# Patient Record
Sex: Female | Born: 1990 | Race: White | Hispanic: No | Marital: Single | Smoking: Former smoker
Health system: Southern US, Community
[De-identification: ages and names within clinical notes are randomized; demographics above are authoritative.]

## PROBLEM LIST (undated history)

## (undated) ENCOUNTER — Inpatient Hospital Stay (HOSPITAL_COMMUNITY): Payer: Self-pay

## (undated) DIAGNOSIS — F329 Major depressive disorder, single episode, unspecified: Secondary | ICD-10-CM

## (undated) DIAGNOSIS — F32A Depression, unspecified: Secondary | ICD-10-CM

## (undated) HISTORY — PX: NO PAST SURGERIES: SHX2092

---
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
## 2013-08-17 NOTE — L&D Delivery Note (Signed)
Delivery Note At 2:27 PM a viable female was delivered via Vaginal, Spontaneous Delivery (Presentation: ; Occiput Anterior).  APGAR: 8, 10; weight pending.   Placenta status: Intact, Spontaneous.  Cord: 3 vessels with the following complications: None.  Cord pH: n/a  Anesthesia: Epidural  Episiotomy: None Lacerations: 1st degree;Perineal Suture Repair: 3.0 vicryl Est. Blood Loss (mL): 350mL  Mom to postpartum.  Baby to Couplet care / Skin to Skin.  Ann Romero, Ann Romero 05/26/2014, 3:12 PM

## 2013-08-17 NOTE — L&D Delivery Note (Signed)
Birth supervised by me. Presentation was ROA. Infant placed directly on mom's abdomen for bonding/skin-to-skin. Cord allowed to stop pulsing, and was clamped x 2, and cut by fob. Repair by me- Bilateral labial and 1st degree perineal.  Plans to breastfeed, op circ, undecided about contraception.  Cheral MarkerKimberly R. Reizel Calzada, CNM, WHNP-BC 05/26/2014 9:02 PM

## 2013-10-06 ENCOUNTER — Inpatient Hospital Stay (HOSPITAL_COMMUNITY): Payer: Medicaid Other

## 2013-10-06 ENCOUNTER — Encounter (HOSPITAL_COMMUNITY): Payer: Self-pay | Admitting: *Deleted

## 2013-10-06 ENCOUNTER — Inpatient Hospital Stay (HOSPITAL_COMMUNITY)
Admission: AD | Admit: 2013-10-06 | Discharge: 2013-10-06 | Disposition: A | Discharge status: Home / Self Care | Payer: Medicaid Other | Source: Ambulatory Visit | Attending: Obstetrics & Gynecology | Admitting: Obstetrics & Gynecology

## 2013-10-06 DIAGNOSIS — R109 Unspecified abdominal pain: Secondary | ICD-10-CM | POA: Insufficient documentation

## 2013-10-06 DIAGNOSIS — O209 Hemorrhage in early pregnancy, unspecified: Secondary | ICD-10-CM

## 2013-10-06 DIAGNOSIS — Z3490 Encounter for supervision of normal pregnancy, unspecified, unspecified trimester: Secondary | ICD-10-CM

## 2013-10-06 DIAGNOSIS — O26859 Spotting complicating pregnancy, unspecified trimester: Secondary | ICD-10-CM

## 2013-10-06 HISTORY — DX: Major depressive disorder, single episode, unspecified: F32.9

## 2013-10-06 HISTORY — DX: Depression, unspecified: F32.A

## 2013-10-06 LAB — CBC
HEMATOCRIT: 32.9 % — AB (ref 36.0–46.0)
Hemoglobin: 11.3 g/dL — ABNORMAL LOW (ref 12.0–15.0)
MCH: 28.8 pg (ref 26.0–34.0)
MCHC: 34.3 g/dL (ref 30.0–36.0)
MCV: 83.9 fL (ref 78.0–100.0)
PLATELETS: 293 10*3/uL (ref 150–400)
RBC: 3.92 MIL/uL (ref 3.87–5.11)
RDW: 14.2 % (ref 11.5–15.5)
WBC: 9 10*3/uL (ref 4.0–10.5)

## 2013-10-06 LAB — WET PREP, GENITAL
Clue Cells Wet Prep HPF POC: NONE SEEN
Trich, Wet Prep: NONE SEEN
YEAST WET PREP: NONE SEEN

## 2013-10-06 LAB — POCT PREGNANCY, URINE: Preg Test, Ur: POSITIVE — AB

## 2013-10-06 LAB — HCG, QUANTITATIVE, PREGNANCY: hCG, Beta Chain, Quant, S: 27346 m[IU]/mL — ABNORMAL HIGH (ref <5)

## 2013-10-06 NOTE — Discharge Instructions (Signed)
Vaginal Bleeding During Pregnancy, First Trimester °A small amount of bleeding (spotting) from the vagina is relatively common in early pregnancy. It usually stops on its own. Various things may cause bleeding or spotting in early pregnancy. Some bleeding may be related to the pregnancy, and some may not. In most cases, the bleeding is normal and is not a problem. However, bleeding can also be a sign of something serious. Be sure to tell your health care provider about any vaginal bleeding right away. °Some possible causes of vaginal bleeding during the first trimester include: °· Infection or inflammation of the cervix. °· Growths (polyps) on the cervix. °· Miscarriage or threatened miscarriage. °· Pregnancy tissue has developed outside of the uterus and in a fallopian tube (tubal pregnancy). °· Tiny cysts have developed in the uterus instead of pregnancy tissue (molar pregnancy). °HOME CARE INSTRUCTIONS  °Watch your condition for any changes. The following actions may help to lessen any discomfort you are feeling: °· Follow your health care provider's instructions for limiting your activity. If your health care provider orders bed rest, you may need to stay in bed and only get up to use the bathroom. However, your health care provider may allow you to continue light activity. °· If needed, make plans for someone to help with your regular activities and responsibilities while you are on bed rest. °· Keep track of the number of pads you use each day, how often you change pads, and how soaked (saturated) they are. Write this down. °· Do not use tampons. Do not douche. °· Do not have sexual intercourse or orgasms until approved by your health care provider. °· If you pass any tissue from your vagina, save the tissue so you can show it to your health care provider. °· Only take over-the-counter or prescription medicines as directed by your health care provider. °· Do not take aspirin because it can make you  bleed. °· Keep all follow-up appointments as directed by your health care provider. °SEEK MEDICAL CARE IF: °· You have any vaginal bleeding during any part of your pregnancy. °· You have cramps or labor pains. °SEEK IMMEDIATE MEDICAL CARE IF:  °· You have severe cramps in your back or belly (abdomen). °· You have a fever, not controlled by medicine. °· You pass large clots or tissue from your vagina. °· Your bleeding increases. °· You feel lightheaded or weak, or you have fainting episodes. °· You have chills. °· You are leaking fluid or have a gush of fluid from your vagina. °· You pass out while having a bowel movement. °MAKE SURE YOU: °· Understand these instructions. °· Will watch your condition. °· Will get help right away if you are not doing well or get worse. °Document Released: 05/13/2005 Document Revised: 05/24/2013 Document Reviewed: 04/10/2013 °ExitCare® Patient Information ©2014 [employer]. ° °

## 2013-10-06 NOTE — MAU Provider Note (Signed)
Chief Complaint: No chief complaint on file.   First Provider Initiated Contact with Patient 10/06/13 2224     SUBJECTIVE HPI: Ann Romero is a 23 y.o. G1P1001 at 7weeks by LMP who presents to maternity admissions reporting bright red vaginal bleeding x1 episode ~4 hours ago when wiping which resolved and abdominal cramping x2-3 days.  She denies vaginal itching/burning, urinary symptoms, h/a, dizziness, n/v, or fever/chills.   Past Medical History  Diagnosis Date  . Depression    History reviewed. No pertinent past surgical history. History   Social History  . Marital Status: Single    Spouse Name: N/A    Number of Children: N/A  . Years of Education: N/A   Occupational History  . Not on file.   Social History Main Topics  . Smoking status: Never Smoker   . Smokeless tobacco: Never Used  . Alcohol Use: No  . Drug Use: No  . Sexual Activity: Yes   Other Topics Concern  . Not on file   Social History Narrative  . No narrative on file   No current facility-administered medications on file prior to encounter.   No current outpatient prescriptions on file prior to encounter.   Allergies  Allergen Reactions  . Penicillins Hives    ROS: Pertinent items in HPI  OBJECTIVE Blood pressure 104/63, pulse 101, temperature 97.7 F (36.5 C), temperature source Oral, resp. rate 16, height 5\' 3"  (1.6 m), weight 63.504 kg (140 lb), last menstrual period 08/17/2013, SpO2 100.00%. GENERAL: Well-developed, well-nourished female in no acute distress.  HEENT: Normocephalic HEART: normal rate RESP: normal effort ABDOMEN: Soft, non-tender EXTREMITIES: Nontender, no edema NEURO: Alert and oriented Pelvic exam: Cervix pink, visually closed, nonfriable, without lesion, scant white creamy discharge, no blood noted, vaginal walls and external genitalia normal Bimanual exam: Cervix 0/long/high, firm, anterior, neg CMT, uterus nontender, slightly enlarged, adnexa without tenderness,  enlargement, or mass  LAB RESULTS Results for orders placed during the hospital encounter of 10/06/13 (from the past 24 hour(s))  CBC     Status: Abnormal   Collection Time    10/06/13 10:08 PM      Result Value Ref Range   WBC 9.0  4.0 - 10.5 K/uL   RBC 3.92  3.87 - 5.11 MIL/uL   Hemoglobin 11.3 (*) 12.0 - 15.0 g/dL   HCT 04.5 (*) 40.9 - 81.1 %   MCV 83.9  78.0 - 100.0 fL   MCH 28.8  26.0 - 34.0 pg   MCHC 34.3  30.0 - 36.0 g/dL   RDW 91.4  78.2 - 95.6 %   Platelets 293  150 - 400 K/uL  HCG, QUANTITATIVE, PREGNANCY     Status: Abnormal   Collection Time    10/06/13 10:08 PM      Result Value Ref Range   hCG, Beta Chain, Quant, [place] [address] (*) <5 mIU/mL  WET PREP, GENITAL     Status: Abnormal   Collection Time    10/06/13 10:26 PM      Result Value Ref Range   Yeast Wet Prep HPF POC NONE SEEN  NONE SEEN   Trich, Wet Prep NONE SEEN  NONE SEEN   Clue Cells Wet Prep HPF POC NONE SEEN  NONE SEEN   WBC, Wet Prep HPF POC MANY (*) NONE SEEN  POCT PREGNANCY, URINE     Status: Abnormal   Collection Time    10/06/13 10:28 PM      Result Value Ref Range   Preg  Test, Ur POSITIVE (*) NEGATIVE    IMAGING US Ob Comp Less 14 Wks  10/06/2013   CLINICAL DATA:  Bright red vaginal spotting.  EXAM: OBSTETRIC <14 WK [place] AND TRANSVAGINAL OB US  TECHNIQUE: Both transabdominal and transvaginal ultrasound examinations were performed for complete evaluation of the gestation as well as the maternal uterus, adnexal regions, and pelvic cul-de-sac. Transvaginal technique was performed to assess early pregnancy.  COMPARISON:  None.  FINDINGS: Intrauterine gestational sac: Visualized/normal in shape.  Yolk sac:  Yes  Embryo:  No  Cardiac Activity: N/A  MSD:  15.7  mm   6 w   3  d  Maternal uterus/adnexae: No subchorionic hemorrhage is noted. The uterus is otherwise unremarkable in appearance.  The right ovary is not visualized. The left ovary is unremarkable in appearance. It measures 2.1 x 1.6 x 1.7 cm.  No free  fluid is seen in the pelvic cul-de-sac.  IMPRESSION: Single intrauterine gestational sac noted, with a yolk sac visualized. The embryo is not yet seen. The mean sac diameter of 1.6 cm corresponds to a gestational age of [redacted] weeks 3 days. This matches the gestational age of [redacted] weeks 1 day by LMP, reflecting an estimated date of delivery of May 24, 2014.   Electronically Signed   By: Roanna Raider M.D.   On: 10/06/2013 23:07   US Ob Transvaginal  10/06/2013   CLINICAL DATA:  Bright red vaginal spotting.  EXAM: OBSTETRIC <14 WK [place] AND TRANSVAGINAL OB US  TECHNIQUE: Both transabdominal and transvaginal ultrasound examinations were performed for complete evaluation of the gestation as well as the maternal uterus, adnexal regions, and pelvic cul-de-sac. Transvaginal technique was performed to assess early pregnancy.  COMPARISON:  None.  FINDINGS: Intrauterine gestational sac: Visualized/normal in shape.  Yolk sac:  Yes  Embryo:  No  Cardiac Activity: N/A  MSD:  15.7  mm   6 w   3  d  Maternal uterus/adnexae: No subchorionic hemorrhage is noted. The uterus is otherwise unremarkable in appearance.  The right ovary is not visualized. The left ovary is unremarkable in appearance. It measures 2.1 x 1.6 x 1.7 cm.  No free fluid is seen in the pelvic cul-de-sac.  IMPRESSION: Single intrauterine gestational sac noted, with a yolk sac visualized. The embryo is not yet seen. The mean sac diameter of 1.6 cm corresponds to a gestational age of [redacted] weeks 3 days. This matches the gestational age of [redacted] weeks 1 day by LMP, reflecting an estimated date of delivery of May 24, 2014.   Electronically Signed   By: Roanna Raider M.D.   On: 10/06/2013 23:07    ASSESSMENT 1. Normal IUP (intrauterine pregnancy) on prenatal ultrasound   2. Vaginal bleeding in pregnant patient at less than [redacted] weeks gestation     PLAN Discharge home with bleeding precautions Pregnancy verification letter given F/U with early prenatal care at [facility] as planned Return to MAU as needed    Medication List         multivitamin-prenatal 27-0.8 MG Tabs tablet  Take 1 tablet by mouth daily at 12 noon.     sertraline 50 MG tablet  Commonly known as:  ZOLOFT  Take 50 mg by mouth daily.       Follow-up Information   Follow up with [facility] [place]. (Or prenatal provider of your choice)    Contact information:   [address] [place] [address] [phone]  [facility] [place] [facility]. (If symptoms worsen)    Contact information:   [address] [id number] [place] [id number] [phone]      Sharen CounterLisa Leftwich-Kirby Certified Nurse-Midwife 10/06/2013  11:28 PM

## 2013-10-06 NOTE — MAU Note (Signed)
Patient states she saw a gush a blood while using the bathroom around 6 pm. No vaginal bleeding now. Abdominal cramping for the last week. Multiple +HPT. LMP 08/17/13

## 2013-10-07 LAB — GC/CHLAMYDIA PROBE AMP
CT PROBE, AMP APTIMA: NEGATIVE
GC Probe RNA: NEGATIVE

## 2013-10-18 ENCOUNTER — Emergency Department (HOSPITAL_BASED_OUTPATIENT_CLINIC_OR_DEPARTMENT_OTHER)
Admission: EM | Admit: 2013-10-18 | Discharge: 2013-10-18 | Disposition: A | Discharge status: Home / Self Care | Payer: Medicaid Other | Attending: Emergency Medicine | Admitting: Emergency Medicine

## 2013-10-18 ENCOUNTER — Encounter (HOSPITAL_BASED_OUTPATIENT_CLINIC_OR_DEPARTMENT_OTHER): Payer: Self-pay | Admitting: Emergency Medicine

## 2013-10-18 DIAGNOSIS — Z880 Allergy status to penicillin: Secondary | ICD-10-CM | POA: Insufficient documentation

## 2013-10-18 DIAGNOSIS — O98519 Other viral diseases complicating pregnancy, unspecified trimester: Secondary | ICD-10-CM | POA: Insufficient documentation

## 2013-10-18 DIAGNOSIS — Z79899 Other long term (current) drug therapy: Secondary | ICD-10-CM | POA: Insufficient documentation

## 2013-10-18 DIAGNOSIS — F3289 Other specified depressive episodes: Secondary | ICD-10-CM | POA: Insufficient documentation

## 2013-10-18 DIAGNOSIS — B349 Viral infection, unspecified: Secondary | ICD-10-CM

## 2013-10-18 DIAGNOSIS — B9789 Other viral agents as the cause of diseases classified elsewhere: Secondary | ICD-10-CM | POA: Insufficient documentation

## 2013-10-18 DIAGNOSIS — Z87891 Personal history of nicotine dependence: Secondary | ICD-10-CM | POA: Insufficient documentation

## 2013-10-18 DIAGNOSIS — F329 Major depressive disorder, single episode, unspecified: Secondary | ICD-10-CM | POA: Insufficient documentation

## 2013-10-18 DIAGNOSIS — O99340 Other mental disorders complicating pregnancy, unspecified trimester: Secondary | ICD-10-CM | POA: Insufficient documentation

## 2013-10-18 LAB — RAPID STREP SCREEN (MED CTR MEBANE ONLY): Streptococcus, Group A Screen (Direct): NEGATIVE

## 2013-10-18 NOTE — ED Provider Notes (Signed)
CSN: [id number]     Arrival date & time 10/18/13  1559 History   First MD Initiated Contact with Patient 10/18/13 1612     Chief Complaint  Patient presents with  . URI     (Consider location/radiation/quality/duration/timing/severity/associated sxs/prior Treatment) Patient is a 23 y.o. female presenting with URI. The history is provided by the patient. No language interpreter was used.  URI Presenting symptoms: congestion, cough, fever and sore throat   Severity:  Moderate Onset quality:  Gradual Duration:  2 weeks Timing:  Constant Progression:  Worsening Chronicity:  New Relieved by:  Nothing Worsened by:  Nothing tried Associated symptoms: no headaches and no myalgias   Risk factors: sick contacts   Risk factors: no recent illness   Pt is 9 weeks pregant.   Pt reports family member had the same.  Pt here with child who has fever of 102.  Pt has had spotting on and off.  [facility]  Past Medical History  Diagnosis Date  . Depression    History reviewed. No pertinent past surgical history. No family history on file. History  Substance Use Topics  . Smoking status: Former Games developermoker  . Smokeless tobacco: Never Used  . Alcohol Use: No   OB History   Grav Para Term Preterm Abortions TAB SAB Ect Mult Living   2 1 1       1      Review of Systems  Constitutional: Positive for fever.  HENT: Positive for congestion and sore throat.   Respiratory: Positive for cough.   Musculoskeletal: Negative for myalgias.  Neurological: Negative for headaches.  All other systems reviewed and are negative.      Allergies  Penicillins  Home Medications   Current Outpatient Rx  Name  Route  Sig  Dispense  Refill  . Prenatal Vit-Fe Fumarate-FA (MULTIVITAMIN-PRENATAL) 27-0.8 MG TABS tablet   Oral   Take 1 tablet by mouth daily at 12 noon.         . sertraline (ZOLOFT) 50 MG tablet   Oral   Take 50 mg by mouth daily.          BP 104/60   Pulse 105  Temp(Src) 98.3 F (36.8 C) (Oral)  Resp 16  Ht 5\' 3"  (1.6 m)  Wt 140 lb (63.504 kg)  BMI 24.81 kg/m2  SpO2 99%  LMP 08/17/2013  Breastfeeding? No Physical Exam  Nursing note and vitals reviewed. Constitutional: She is oriented to person, place, and time. She appears well-developed and well-nourished.  HENT:  Head: Normocephalic and atraumatic.  Right Ear: External ear normal.  Nose: Nose normal.  Erythema throat  Eyes: Conjunctivae and EOM are normal. Pupils are equal, round, and reactive to light.  Neck: Normal range of motion. Neck supple.  Cardiovascular: Normal rate and normal heart sounds.   Pulmonary/Chest: Effort normal and breath sounds normal.  Abdominal: Soft. She exhibits no distension.  Musculoskeletal: Normal range of motion.  Neurological: She is alert and oriented to person, place, and time.  Skin: Skin is warm.  Psychiatric: She has a normal mood and affect.    ED Course  Procedures (including critical care time) Labs Review Labs Reviewed  RAPID STREP SCREEN  CULTURE, GROUP A STREP   Imaging Review No results found.   EKG Interpretation None      MDM   Final diagnoses:  Viral illness    Pt advised follow up as schedule for prenatal care.  I think  illness is viral     Elson Areas, [place] 10/18/13 1753

## 2013-10-18 NOTE — ED Notes (Addendum)
Runny nose, sore throat and cough x2 weeks. Pt mentioned later that she is also here for suprapubic cramping and spotting x2 weeks.  Is [redacted] wks pregnant.  No obgyn care yet.  [facility].  Had US and nothing found. Problem continues.

## 2013-10-18 NOTE — Discharge Instructions (Signed)
Viral Infections °A virus is a type of germ. Viruses can cause: °· Minor sore throats. °· Aches and pains. °· Headaches. °· Runny nose. °· Rashes. °· Watery eyes. °· Tiredness. °· Coughs. °· Loss of appetite. °· Feeling sick to your stomach (nausea). °· Throwing up (vomiting). °· Watery poop (diarrhea). °HOME CARE  °· Only take medicines as told by your doctor. °· Drink enough water and fluids to keep your pee (urine) clear or pale yellow. Sports drinks are a good choice. °· Get plenty of rest and eat healthy. Soups and broths with crackers or rice are fine. °GET HELP RIGHT AWAY IF:  °· You have a very bad headache. °· You have shortness of breath. °· You have chest pain or neck pain. °· You have an unusual rash. °· You cannot stop throwing up. °· You have watery poop that does not stop. °· You cannot keep fluids down. °· You or your child has a temperature by mouth above 102° F (38.9° C), not controlled by medicine. °· Your baby is older than 3 months with a rectal temperature of 102° F (38.9° C) or higher. °· Your baby is 3 months old or younger with a rectal temperature of 100.4° F (38° C) or higher. °MAKE SURE YOU:  °· Understand these instructions. °· Will watch this condition. °· Will get help right away if you are not doing well or get worse. °Document Released: 07/16/2008 Document Revised: 10/26/2011 Document Reviewed: 12/09/2010 °ExitCare® Patient Information ©2014 [employer]. ° °

## 2013-10-21 ENCOUNTER — Encounter (HOSPITAL_COMMUNITY): Payer: Self-pay | Admitting: Family

## 2013-10-21 ENCOUNTER — Inpatient Hospital Stay (HOSPITAL_COMMUNITY): Payer: Medicaid Other

## 2013-10-21 ENCOUNTER — Inpatient Hospital Stay (HOSPITAL_COMMUNITY)
Admission: AD | Admit: 2013-10-21 | Discharge: 2013-10-21 | Disposition: A | Discharge status: Home / Self Care | Payer: Medicaid Other | Source: Ambulatory Visit | Attending: Obstetrics & Gynecology | Admitting: Obstetrics & Gynecology

## 2013-10-21 DIAGNOSIS — F3289 Other specified depressive episodes: Secondary | ICD-10-CM | POA: Insufficient documentation

## 2013-10-21 DIAGNOSIS — O418X90 Other specified disorders of amniotic fluid and membranes, unspecified trimester, not applicable or unspecified: Secondary | ICD-10-CM

## 2013-10-21 DIAGNOSIS — Z3490 Encounter for supervision of normal pregnancy, unspecified, unspecified trimester: Secondary | ICD-10-CM

## 2013-10-21 DIAGNOSIS — J069 Acute upper respiratory infection, unspecified: Secondary | ICD-10-CM | POA: Insufficient documentation

## 2013-10-21 DIAGNOSIS — O26859 Spotting complicating pregnancy, unspecified trimester: Secondary | ICD-10-CM

## 2013-10-21 DIAGNOSIS — O468X9 Other antepartum hemorrhage, unspecified trimester: Secondary | ICD-10-CM

## 2013-10-21 DIAGNOSIS — R0989 Other specified symptoms and signs involving the circulatory and respiratory systems: Secondary | ICD-10-CM

## 2013-10-21 DIAGNOSIS — Z87891 Personal history of nicotine dependence: Secondary | ICD-10-CM | POA: Insufficient documentation

## 2013-10-21 DIAGNOSIS — F329 Major depressive disorder, single episode, unspecified: Secondary | ICD-10-CM | POA: Insufficient documentation

## 2013-10-21 DIAGNOSIS — O208 Other hemorrhage in early pregnancy: Secondary | ICD-10-CM | POA: Insufficient documentation

## 2013-10-21 LAB — CBC
HCT: 33.4 % — ABNORMAL LOW (ref 36.0–46.0)
Hemoglobin: 11.5 g/dL — ABNORMAL LOW (ref 12.0–15.0)
MCH: 29.2 pg (ref 26.0–34.0)
MCHC: 34.4 g/dL (ref 30.0–36.0)
MCV: 84.8 fL (ref 78.0–100.0)
Platelets: 337 10*3/uL (ref 150–400)
RBC: 3.94 MIL/uL (ref 3.87–5.11)
RDW: 13.6 % (ref 11.5–15.5)
WBC: 11.3 10*3/uL — ABNORMAL HIGH (ref 4.0–10.5)

## 2013-10-21 LAB — ABO/RH: ABO/RH(D): O POS

## 2013-10-21 MED ORDER — HYDROCODONE-HOMATROPINE 5-1.5 MG/5ML PO SYRP: 5.0000 mL | ORAL_SOLUTION | Freq: Four times a day (QID) | ORAL | Status: DC | PRN

## 2013-10-21 MED ORDER — PROMETHAZINE HCL 25 MG PO TABS
25.0000 mg | ORAL_TABLET | Freq: Once | ORAL | Status: AC
  Administered 2013-10-21: 25 mg via ORAL
  Filled 2013-10-21: qty 1

## 2013-10-21 MED ORDER — DM-GUAIFENESIN ER 30-600 MG PO TB12
1.0000 | ORAL_TABLET | Freq: Once | ORAL | Status: AC
  Administered 2013-10-21: 1 via ORAL
  Filled 2013-10-21: qty 1

## 2013-10-21 NOTE — MAU Note (Signed)
23 yo, G2P1 at 227w2d, presents with c/o passing a quarter-sized clot today at 1030; reports occasional spotting throughout pregnancy, never passed clot. Reports lower abdominal cramping last several weeks. 10/26/13 first scheduled appt.

## 2013-10-21 NOTE — MAU Provider Note (Signed)
History     CSN: [id number]  Arrival date and time: 10/21/13 1303   First Provider Initiated Contact with Patient 10/21/13 1455      Chief Complaint  Patient presents with  . Vaginal Bleeding   HPI  [name] is a 23 y.o. female G2P1001 at 6664w2d who presents with complaints of vaginal bleeding. The bleeding was first noticed two weeks ago; the patient has not started prenatal care. She was evaluated here for the bleeding recently and no cause for the bleeding was found. She was told to come back if the bleeding increased. This morning she passed a blood clot and that warranted her to be seen by a provider. The patient plans to start care at the clinic downs stairs and requests a referral. She also complains of upper respiratory symptoms; cough, congestion. Most of her family members have had the same symptoms; pt has tried tylenol over the counter. The symptoms have been going on for 1 week and the cough is keeping her up at night.    Past Medical History  Diagnosis Date  . Depression     History reviewed. No pertinent past surgical history.  History reviewed. No pertinent family history.  History  Substance Use Topics  . Smoking status: Former Smoker    Quit date: 07/23/2013  . Smokeless tobacco: Never Used  . Alcohol Use: No    Allergies:  Allergies  Allergen Reactions  . Penicillins Hives    Prescriptions prior to admission  Medication Sig Dispense Refill  . acetaminophen (TYLENOL) 325 MG tablet Take 650 mg by mouth every 6 (six) hours as needed for mild pain.      . Prenatal Vit-Fe Fumarate-FA (PRENATAL MULTIVITAMIN) TABS tablet Take 1 tablet by mouth every morning.      . sertraline (ZOLOFT) 50 MG tablet Take 50 mg by mouth daily.       Results for orders placed during the hospital encounter of 10/21/13 (from the past 48 hour(s))  CBC     Status: Abnormal   Collection Time    10/21/13  3:12 PM      Result Value Ref Range   WBC 11.3 (*) 4.0 - 10.5  K/uL   RBC 3.94  3.87 - 5.11 MIL/uL   Hemoglobin 11.5 (*) 12.0 - 15.0 g/dL   HCT 30.133.4 (*) 60.136.0 - 09.346.0 %   MCV 84.8  78.0 - 100.0 fL   MCH 29.2  26.0 - 34.0 pg   MCHC 34.4  30.0 - 36.0 g/dL   RDW 23.513.6  57.311.5 - 22.015.5 %   Platelets 337  150 - 400 K/uL  ABO/RH     Status: None   Collection Time    10/21/13  3:12 PM      Result Value Ref Range   ABO/RH(D) O POS      [place] Ob Comp Less 14 Wks  10/21/2013   CLINICAL DATA:  Bleeding.  Unsure of LMP.  EXAM: OBSTETRIC <14 WK ULTRASOUND  TECHNIQUE: Transabdominal ultrasound was performed for evaluation of the gestation as well as the maternal uterus and adnexal regions.  COMPARISON:  US OB COMP LESS 14 WKS dated 10/06/2013  FINDINGS: Intrauterine gestational sac: Visualized/normal in shape.  Yolk sac:  Present  Embryo:  Present  Cardiac Activity: Present  Heart Rate: 172 bpm  CRL:   16.6  mm;   8 w 1 d;             US EDC: 06/01/2014  Maternal  uterus/adnexae: Small areas of subchorionic hemorrhage are noted, measuring 2.4 x 0.6 x 1.2 cm and 1.7 x 0.8 x 0.5 cm. The right and left ovaries were not clearly visualized.  IMPRESSION: Single viable fetus 8 weeks 1 day gestational age with an EDC of 06/01/2014.  Two small areas of subchorionic hemorrhage as described above which were not present previously.   Electronically Signed   By: Davonna Belling M.D.   On: 10/21/2013 17:37     Review of Systems  Constitutional: Negative for fever and chills.  HENT: Positive for congestion.   Respiratory: Positive for cough.   Gastrointestinal: Negative for abdominal pain.  Genitourinary:       No vaginal discharge. + vaginal bleeding. No dysuria.    Physical Exam   Blood pressure 105/61, pulse 117, temperature 98.3 F (36.8 C), temperature source Oral, resp. rate 15, height 5\' 3"  (1.6 m), weight 63.504 kg (140 lb), last menstrual period 08/17/2013, SpO2 100.00%, not currently breastfeeding.  Physical Exam  Vitals reviewed. Constitutional: She is oriented to person,  place, and time. She appears well-developed and well-nourished. No distress.  HENT:  Head: Normocephalic.  Eyes: Pupils are equal, round, and reactive to light.  Neck: Normal range of motion. Neck supple.  Cardiovascular: Normal rate.   Respiratory: Effort normal and breath sounds normal. No respiratory distress. She has no wheezes. She has no rales.  GI: Soft. She exhibits no distension. There is no tenderness. There is no rebound.  Musculoskeletal: Normal range of motion.  Neurological: She is alert and oriented to person, place, and time.  Skin: Skin is warm and dry.  Psychiatric: She has a normal mood and affect.    MAU Course  Procedures None  Care handed over to [facility] Rash, NP CBC  O positive blood type  A list of safe medications to take for cold has been provided to the patient  Pt has a prenatal appointment scheduled in [place] next week and plans to transfer to the clinic downstairs.   Assessment and Plan   A:  1. Normal IUP (intrauterine pregnancy) on prenatal ultrasound   2. Subchorionic hemorrhage   3. Symptoms of upper respiratory infection (URI)     P:  Discharge home in stable condition  Bleeding precautions discussed  RX: hycodan  Over the counter medications discussed  Start prenatal care ASAP Seek medical attention if URI symptoms do not improve or subside in a week Pelvic rest  Ann Hansen Cresencia Asmus, NP 10/21/2013 8:10 PM   LINEBERRY,SUSAN 10/21/2013, 5:37 PM

## 2013-10-23 LAB — CULTURE, GROUP A STREP

## 2013-10-24 NOTE — Progress Notes (Signed)
ED Antimicrobial Stewardship Positive Culture Follow Up   Ann Romero is an 23 y.o. female who presented to [facility] on 10/18/2013 with a chief complaint of  Chief Complaint  Patient presents with  . URI    Recent Results (from the past 720 hour(s))  WET PREP, GENITAL     Status: Abnormal   Collection Time    10/06/13 10:26 PM      Result Value Ref Range Status   Yeast Wet Prep HPF POC NONE SEEN  NONE SEEN Final   Trich, Wet Prep NONE SEEN  NONE SEEN Final   Clue Cells Wet Prep HPF POC NONE SEEN  NONE SEEN Final   WBC, Wet Prep HPF POC MANY (*) NONE SEEN Final   Comment: MODERATE BACTERIA SEEN  GC/CHLAMYDIA PROBE AMP     Status: None   Collection Time    10/06/13 10:26 PM      Result Value Ref Range Status   CT Probe RNA NEGATIVE  NEGATIVE Final   GC Probe RNA NEGATIVE  NEGATIVE Final   Comment: (NOTE)                                                                                               **Normal Reference Range: Negative**          Assay performed using the Gen-Probe APTIMA COMBO2 (R) Assay.     Acceptable specimen types for this assay include APTIMA Swabs (Unisex,     endocervical, urethral, or vaginal), first void urine, and ThinPrep     liquid based cytology samples.     Performed at [facility]  RAPID STREP SCREEN     Status: None   Collection Time    10/18/13  4:38 PM      Result Value Ref Range Status   Streptococcus, Group A Screen (Direct) NEGATIVE  NEGATIVE Final   Comment: (NOTE)     A Rapid Antigen test may result negative if the antigen level in the     sample is below the detection level of this test. The FDA has not     cleared this test as a stand-alone test therefore the rapid antigen     negative result has reflexed to a Group A Strep culture.  CULTURE, GROUP A STREP     Status: None   Collection Time    10/18/13  4:38 PM      Result Value Ref Range Status   Specimen Description THROAT   Final   Special Requests NONE   Final   Culture     Final   Value: STREPTOCOCCUS,BETA HEMOLYTIC NOT GROUP A     Performed at [facility]   Report Status 10/23/2013 FINAL   Final   22yo pregnant female presented to ED on 3/4 and 3/7 with URI complaints (as well as vaginal bleeding which was addressed at [facility]). Discussed culture findings with Tiburcio Pea PA - she recommends to proceed with treatment below.  New antibiotic prescription: Azithromycin 500mg  PO on Day 1, then 250mg  PO daily on Days 2-5.  ED Provider: Arthor Captain, PA-C  Cleon DewDulaney, [place] Robert 10/24/2013, 11:49 AM Infectious Diseases Pharmacist Phone# [phone]

## 2013-10-25 ENCOUNTER — Telehealth (HOSPITAL_COMMUNITY): Payer: Self-pay | Admitting: *Deleted

## 2013-10-25 NOTE — ED Notes (Signed)
Current srciber called to check on condition of patient and patient is feeling better .

## 2013-10-25 NOTE — ED Provider Notes (Signed)
Medical screening examination/treatment/procedure(s) were performed by non-physician practitioner and as supervising physician I was immediately available for consultation/collaboration.   EKG Interpretation None        Clorissa Gruenberg, MD 10/25/13 0722 

## 2013-11-16 LAB — OB RESULTS CONSOLE HEPATITIS B SURFACE ANTIGEN: Hepatitis B Surface Ag: NEGATIVE

## 2013-11-16 LAB — OB RESULTS CONSOLE ANTIBODY SCREEN: Antibody Screen: NEGATIVE

## 2013-11-16 LAB — OB RESULTS CONSOLE RUBELLA ANTIBODY, IGM: Rubella: IMMUNE

## 2013-11-16 LAB — OB RESULTS CONSOLE ABO/RH: RH TYPE: POSITIVE

## 2013-11-16 LAB — OB RESULTS CONSOLE RPR: RPR: NONREACTIVE

## 2013-11-16 LAB — OB RESULTS CONSOLE HIV ANTIBODY (ROUTINE TESTING): HIV: NONREACTIVE

## 2013-11-29 ENCOUNTER — Encounter (HOSPITAL_COMMUNITY): Payer: Self-pay | Admitting: *Deleted

## 2013-11-29 ENCOUNTER — Inpatient Hospital Stay (HOSPITAL_COMMUNITY)
Admission: AD | Admit: 2013-11-29 | Discharge: 2013-11-29 | Disposition: A | Discharge status: Home / Self Care | Payer: Medicaid Other | Source: Ambulatory Visit | Attending: Obstetrics & Gynecology | Admitting: Obstetrics & Gynecology

## 2013-11-29 DIAGNOSIS — IMO0002 Reserved for concepts with insufficient information to code with codable children: Secondary | ICD-10-CM | POA: Insufficient documentation

## 2013-11-29 DIAGNOSIS — O208 Other hemorrhage in early pregnancy: Secondary | ICD-10-CM | POA: Insufficient documentation

## 2013-11-29 DIAGNOSIS — O209 Hemorrhage in early pregnancy, unspecified: Secondary | ICD-10-CM | POA: Insufficient documentation

## 2013-11-29 DIAGNOSIS — Z87891 Personal history of nicotine dependence: Secondary | ICD-10-CM | POA: Insufficient documentation

## 2013-11-29 DIAGNOSIS — Y92009 Unspecified place in unspecified non-institutional (private) residence as the place of occurrence of the external cause: Secondary | ICD-10-CM | POA: Insufficient documentation

## 2013-11-29 DIAGNOSIS — R109 Unspecified abdominal pain: Secondary | ICD-10-CM | POA: Insufficient documentation

## 2013-11-29 LAB — URINALYSIS, ROUTINE W REFLEX MICROSCOPIC
Bilirubin Urine: NEGATIVE
Glucose, UA: NEGATIVE mg/dL
Hgb urine dipstick: NEGATIVE
Ketones, ur: NEGATIVE mg/dL
NITRITE: NEGATIVE
PROTEIN: NEGATIVE mg/dL
SPECIFIC GRAVITY, URINE: 1.01 (ref 1.005–1.030)
Urobilinogen, UA: 0.2 mg/dL (ref 0.0–1.0)
pH: 5.5 (ref 5.0–8.0)

## 2013-11-29 LAB — URINE MICROSCOPIC-ADD ON

## 2013-11-29 NOTE — MAU Provider Note (Signed)
History     CSN: [id number]  Arrival date and time: 11/29/13 1542   None     No chief complaint on file.  HPI  [name] is a 23 y.o. G2P1001 10995w5d presenting for some spotting of blood after her toddler fell over her suprapubic/lower abdominal area. Patient reports that she was laying in bed when her toddler "jumped and landed on her lower abdomen". 30 minutes later, the patient states she noticed some spotting of blood. She admits crampy abdominal pains that have been present throughout her pregnancy. Denies vaginal pain, N/V, cp, sob. [name] has urinated since and did not have any issues including hematuria. She presents here in the MAU "to make sure everything is okay".  Of note, patient had an US done on 10/21/2013, that showed per Dr. Davonna BellingJohn Curnes, "Two small areas of subchorionic hemorrhage as described above which were not present previously".   OB History   Grav Para Term Preterm Abortions TAB SAB Ect Mult Living   2 1 1       1       Past Medical History  Diagnosis Date  . Depression     History reviewed. No pertinent past surgical history.  History reviewed. No pertinent family history.  History  Substance Use Topics  . Smoking status: Former Smoker    Quit date: 07/23/2013  . Smokeless tobacco: Never Used  . Alcohol Use: No    Allergies:  Allergies  Allergen Reactions  . Penicillins Hives    Prescriptions prior to admission  Medication Sig Dispense Refill  . acetaminophen (TYLENOL) 325 MG tablet Take 650 mg by mouth every 6 (six) hours as needed for mild pain.      . Prenatal Vit-Fe Fumarate-FA (PRENATAL MULTIVITAMIN) TABS tablet Take 1 tablet by mouth every morning.      . sertraline (ZOLOFT) 50 MG tablet Take 50 mg by mouth daily.      Marland Kitchen. HYDROcodone-homatropine (HYCODAN) 5-1.5 MG/5ML syrup Take 5 mLs by mouth every 6 (six) hours as needed for cough.  120 mL  0    ROS As in HPI  Physical Exam   Blood pressure 91/75, pulse 84,  temperature 98.5 F (36.9 C), temperature source Oral, resp. rate 20, height 5' 1.5" (1.562 m), weight 70.761 kg (156 lb), last menstrual period 08/17/2013, not currently breastfeeding.  Physical Exam  Constitutional: She is oriented to person, place, and time. She appears well-developed and well-nourished. No distress.  HENT:  Head: Normocephalic and atraumatic.  Cardiovascular: Normal rate, regular rhythm and intact distal pulses.  Exam reveals no gallop and no friction rub.   No murmur heard. Respiratory: Effort normal and breath sounds normal. No respiratory distress.  GI: Soft. Bowel sounds are normal. She exhibits no distension. There is tenderness (mild) in the right lower quadrant and suprapubic area. There is no rebound and no guarding.  Genitourinary:  Uterus is firm and at umbilicus.  Neurological: She is alert and oriented to person, place, and time.  Skin: Skin is warm and dry. She is not diaphoretic.  Psychiatric: She has a normal mood and affect.     MAU Course  Procedures  Results for orders placed during the hospital encounter of 11/29/13 (from the past 24 hour(s))  URINALYSIS, ROUTINE W REFLEX MICROSCOPIC     Status: Abnormal   Collection Time    11/29/13  4:15 PM      Result Value Ref Range   Color, Urine YELLOW  YELLOW   APPearance  CLEAR  CLEAR   Specific Gravity, Urine 1.010  1.005 - 1.030   pH 5.5  5.0 - 8.0   Glucose, UA NEGATIVE  NEGATIVE mg/dL   Hgb urine dipstick NEGATIVE  NEGATIVE   Bilirubin Urine NEGATIVE  NEGATIVE   Ketones, ur NEGATIVE  NEGATIVE mg/dL   Protein, ur NEGATIVE  NEGATIVE mg/dL   Urobilinogen, UA 0.2  0.0 - 1.0 mg/dL   Nitrite NEGATIVE  NEGATIVE   Leukocytes, UA MODERATE (*) NEGATIVE  URINE MICROSCOPIC-ADD ON     Status: Abnormal   Collection Time    11/29/13  4:15 PM      Result Value Ref Range   Squamous Epithelial / LPF MANY (*) RARE   WBC, UA 11-20  <3 WBC/hpf   RBC / HPF 3-6  <3 RBC/hpf   Bacteria, UA MANY (*) RARE   FHT:  163bpm  MDM [name] is a 23 y.o. G2P1001 7370w5d presenting for some spotting of blood likely d/t previous subchorionic hemorrhage.  Assessment and Plan   A: Mild vaginal bleeding possibly secondary to previous subchorionic hemorrhage found on US.  P[facility] home[facility]  [facility] 11/29/2013, 4:52 PM  Evaluation and management procedures were performed by PA-S under my supervision/collaboration. Chart reviewed, patient examined by me and I agree with management and plan. [facility]ained [facility] to couple. 1. Bleeding in early pregnancy      Medication List         acetaminophen 325 MG tablet  Commonly known as:  TYLENOL  Take 650 mg by mouth every 6 (six) hours as needed for mild pain.     HYDROcodone-homatropine 5-1.5 MG/5ML syrup  Commonly known as:  HYCODAN  Take 5 mLs by mouth every 6 (six) hours as needed for cough.     prenatal multivitamin Tabs tablet  Take 1 tablet by mouth every morning.     sertraline 50 MG tablet  Commonly known as:  ZOLOFT  Take 50 mg by mouth daily.       Follow-up Information   [facility] [place] On 12/14/2013.   Contact information:   [address] [place] [id number] [phone]

## 2013-11-29 NOTE — MAU Note (Signed)
Toddler jumped on stomach earlier and about laster started bleeding.  Bleeding has stopped but is still cramping.

## 2013-11-29 NOTE — Discharge Instructions (Signed)
Vaginal Bleeding During Pregnancy, Second Trimester °A small amount of bleeding (spotting) from the vagina is relatively common in pregnancy. It usually stops on its own. Various things can cause bleeding or spotting in pregnancy. Some bleeding may be related to the pregnancy, and some may not. Sometimes the bleeding is normal and is not a problem. However, bleeding can also be a sign of something serious. Be sure to tell your health care provider about any vaginal bleeding right away. °Some possible causes of vaginal bleeding during the second trimester include: °· Infection, inflammation, or growths on the cervix.   °· The placenta may be partially or completely covering the opening of the cervix inside the uterus (placenta previa). °· The placenta may have separated from the uterus (abruption of the placenta).   °· You may be having early (preterm) labor.   °· The cervix may not be strong enough to keep a baby inside the uterus (cervical insufficiency).   °· Tiny cysts may have developed in the uterus instead of pregnancy tissue (molar pregnancy).  °HOME CARE INSTRUCTIONS  °Watch your condition for any changes. The following actions may help to lessen any discomfort you are feeling: °· Follow your health care provider's instructions for limiting your activity. If your health care provider orders bed rest, you may need to stay in bed and only get up to use the bathroom. However, your health care provider may allow you to continue light activity. °· If needed, make plans for someone to help with your regular activities and responsibilities while you are on bed rest. °· Keep track of the number of pads you use each day, how often you change pads, and how soaked (saturated) they are. Write this down. °· Do not use tampons. Do not douche. °· Do not have sexual intercourse or orgasms until approved by your health care provider. °· If you pass any tissue from your vagina, save the tissue so you can show it to your  health care provider. °· Only take over-the-counter or prescription medicines as directed by your health care provider. °· Do not take aspirin because it can make you bleed. °· Do not exercise or perform any strenuous activities or heavy lifting without your health care provider's permission. °· Keep all follow-up appointments as directed by your health care provider. °SEEK MEDICAL CARE IF: °· You have any vaginal bleeding during any part of your pregnancy. °· You have cramps or labor pains. °SEEK IMMEDIATE MEDICAL CARE IF:  °· You have severe cramps in your back or belly (abdomen). °· You have contractions. °· You have a fever, not controlled by medicine. °· You have chills. °· You pass large clots or tissue from your vagina. °· Your bleeding increases. °· You feel lightheaded or weak, or you have fainting episodes. °· You are leaking fluid or have a gush of fluid from your vagina. °MAKE SURE YOU: °· Understand these instructions. °· Will watch your condition. °· Will get help right away if you are not doing well or get worse. °Document Released: 05/13/2005 Document Revised: 05/24/2013 Document Reviewed: 04/10/2013 °ExitCare® Patient Information ©2014 [employer]. ° °

## 2013-12-18 ENCOUNTER — Other Ambulatory Visit (HOSPITAL_COMMUNITY): Payer: Self-pay | Admitting: Nurse Practitioner

## 2013-12-18 DIAGNOSIS — Z3689 Encounter for other specified antenatal screening: Secondary | ICD-10-CM

## 2014-01-09 ENCOUNTER — Ambulatory Visit (HOSPITAL_COMMUNITY)
Admission: RE | Admit: 2014-01-09 | Discharge: 2014-01-09 | Disposition: A | Discharge status: Home / Self Care | Payer: Medicaid Other | Source: Ambulatory Visit | Attending: Nurse Practitioner | Admitting: Nurse Practitioner

## 2014-01-09 DIAGNOSIS — Z3689 Encounter for other specified antenatal screening: Secondary | ICD-10-CM | POA: Insufficient documentation

## 2014-01-25 ENCOUNTER — Other Ambulatory Visit (HOSPITAL_COMMUNITY): Payer: Self-pay | Admitting: Nurse Practitioner

## 2014-01-25 DIAGNOSIS — IMO0002 Reserved for concepts with insufficient information to code with codable children: Secondary | ICD-10-CM

## 2014-01-25 DIAGNOSIS — Z0489 Encounter for examination and observation for other specified reasons: Secondary | ICD-10-CM

## 2014-02-08 ENCOUNTER — Ambulatory Visit (HOSPITAL_COMMUNITY)
Admission: RE | Admit: 2014-02-08 | Discharge: 2014-02-08 | Disposition: A | Discharge status: Home / Self Care | Payer: Medicaid Other | Source: Ambulatory Visit | Attending: Nurse Practitioner | Admitting: Nurse Practitioner

## 2014-02-08 DIAGNOSIS — Z363 Encounter for antenatal screening for malformations: Secondary | ICD-10-CM | POA: Insufficient documentation

## 2014-02-08 DIAGNOSIS — IMO0002 Reserved for concepts with insufficient information to code with codable children: Secondary | ICD-10-CM

## 2014-02-08 DIAGNOSIS — Z0489 Encounter for examination and observation for other specified reasons: Secondary | ICD-10-CM

## 2014-02-08 DIAGNOSIS — Z1389 Encounter for screening for other disorder: Secondary | ICD-10-CM | POA: Insufficient documentation

## 2014-02-22 ENCOUNTER — Inpatient Hospital Stay (HOSPITAL_COMMUNITY)
Admission: AD | Admit: 2014-02-22 | Discharge: 2014-02-22 | Disposition: A | Discharge status: Home / Self Care | Payer: Medicaid Other | Source: Ambulatory Visit | Attending: Obstetrics & Gynecology | Admitting: Obstetrics & Gynecology

## 2014-02-22 ENCOUNTER — Encounter (HOSPITAL_COMMUNITY): Payer: Self-pay | Admitting: *Deleted

## 2014-02-22 DIAGNOSIS — Z87891 Personal history of nicotine dependence: Secondary | ICD-10-CM | POA: Diagnosis not present

## 2014-02-22 DIAGNOSIS — O99891 Other specified diseases and conditions complicating pregnancy: Secondary | ICD-10-CM | POA: Diagnosis not present

## 2014-02-22 DIAGNOSIS — Z3493 Encounter for supervision of normal pregnancy, unspecified, third trimester: Secondary | ICD-10-CM

## 2014-02-22 DIAGNOSIS — O9989 Other specified diseases and conditions complicating pregnancy, childbirth and the puerperium: Principal | ICD-10-CM

## 2014-02-22 DIAGNOSIS — N898 Other specified noninflammatory disorders of vagina: Secondary | ICD-10-CM | POA: Diagnosis present

## 2014-02-22 DIAGNOSIS — O4700 False labor before 37 completed weeks of gestation, unspecified trimester: Secondary | ICD-10-CM | POA: Diagnosis not present

## 2014-02-22 LAB — WET PREP, GENITAL
Clue Cells Wet Prep HPF POC: NONE SEEN
TRICH WET PREP: NONE SEEN
Yeast Wet Prep HPF POC: NONE SEEN

## 2014-02-22 LAB — URINALYSIS, ROUTINE W REFLEX MICROSCOPIC
BILIRUBIN URINE: NEGATIVE
Glucose, UA: NEGATIVE mg/dL
Hgb urine dipstick: NEGATIVE
Ketones, ur: NEGATIVE mg/dL
Nitrite: NEGATIVE
PROTEIN: NEGATIVE mg/dL
Specific Gravity, Urine: <1.005 — ABNORMAL LOW (ref 1.005–1.030)
Urobilinogen, UA: 0.2 mg/dL (ref 0.0–1.0)
pH: 6.5 (ref 5.0–8.0)

## 2014-02-22 LAB — URINE MICROSCOPIC-ADD ON

## 2014-02-22 LAB — AMNISURE RUPTURE OF MEMBRANE (ROM) NOT AT ARMC: AMNISURE: NEGATIVE

## 2014-02-22 LAB — POCT FERN TEST: POCT Fern Test: NEGATIVE

## 2014-02-22 NOTE — Discharge Instructions (Signed)
Premature Rupture and Preterm Premature Rupture of Membranes Premature rupture of membranes (PROM) is when the membranes (amniotic sac) break open before contractions or labor starts. Rupture of membranes is commonly referred to as your water breaking. If PROM occurs before 37 weeks of pregnancy, it is called preterm premature rupture of membranes (PPROM). The amniotic sac holds the fetus, keeps infection out, and performs other important functions. Having the amniotic sac rupture before 37 weeks of pregnancy can lead to serious problems and requires immediate attention by your health care provider. CAUSES  PROM near the end of the pregnancy may be caused by natural weakening of the membranes. PPROM is often due to an infection. Other factors that may be associated with PROM include:  Stretching of the amniotic sac because of carrying multiples or having too much amniotic fluid.  Trauma.  Smoking during pregnancy.  Poor nutrition.  Previous preterm birth.  Vaginal bleeding.  Little to no prenatal care.  Problems with the placenta, such as placenta previa or placental abruption. RISKS OF PROM AND PPROM  Delivering a premature baby.  Getting a serious infection of the placental tissues (chorioamnionitis).  Early detachment of the placenta from the uterus (placental abruption).  Compression of the umbilical cord.  Needing a cesarean birth.  Developing a serious infection after delivery. SIGNS OF PROM OR PPROM   A sudden gush or slow leaking of fluid from the vagina.  Constant wet underwear. Sometimes, women mistake the leaking or wetness for urine, especially if the leak is slow and not a gush of fluid. If there is constant leaking or your underwear continues to get wet, your membranes have likely ruptured. WHAT TO DO IF YOU THINK YOUR MEMBRANES HAVE RUPTURED Call your health care provider right away. You will need to go to the hospital to get checked immediately. WHAT HAPPENS  IF YOU ARE DIAGNOSED WITH PROM OR PPROM? Once you arrive at the hospital, you will have tests done. A cervical exam will be performed to check if the cervix has softened or started to open (dilate). If you are diagnosed with PROM, you may be induced within 24 hours if you are not having contractions. If you are diagnosed with PPROM and are not having contractions, you may be induced depending on your trimester.  If you have PPROM, you:  And your baby will be monitored closely for signs of infection or other complications.  May be given an antibiotic medicine to lower the chances of an infection developing.  May be given a steroid medicine to help mature the baby's lungs faster.  May be given a medicine to stop preterm labor.  May be ordered to be on bed rest at home or in the hospital.  May be induced if complications arise for you or the baby. Your treatment will depend on many factors, such as how far along you are, the development of the baby, and other complications that may arise. Document Released: 08/03/2005 Document Revised: 05/24/2013 Document Reviewed: 11/22/2012 [facility] Patient Information 2015 [place]. This information is not intended to replace advice given to you by your health care provider. Make sure you discuss any questions you have with your health care provider.   Thanks for letting us take care of you!

## 2014-02-22 NOTE — MAU Provider Note (Signed)
None     Chief Complaint:  Vaginal Discharge   Salem CasterBreanna Berk is  23 y.o. G2P1001 at 4590w6d presents complaining of Vaginal Discharge .  She states irregular, every 10 minutes contractions are associated with none vaginal bleeding, intact, clear fluid membranes, along with active fetal movement. Pt. Was in kitchen when her husband noted a small puddle of fluid on the floor where she was standing. They also noted "gush" of fluid as she began walking to the bathroom. She is complaining of mild abdominal tightness, and a small amount of periodic abdominal pain. She denies vaginal bleeding. She has no history of preterm birth. She has not had sexual contact in the past 24 hours. She denies dysuria, hematuria, or vaginal burning / itching. She denies trauma. She has no other complaints.   Obstetrical/Gynecological History: OB History   Grav Para Term Preterm Abortions TAB SAB Ect Mult Living   2 1 1       1      Past Medical History: Past Medical History  Diagnosis Date  . Depression     Past Surgical History: Past Surgical History  Procedure Laterality Date  . No past surgeries      Family History: History reviewed. No pertinent family history.  Social History: History  Substance Use Topics  . Smoking status: Former Smoker    Quit date: 07/23/2013  . Smokeless tobacco: Never Used  . Alcohol Use: No    Allergies:  Allergies  Allergen Reactions  . Penicillins Hives    Meds:  Prescriptions prior to admission  Medication Sig Dispense Refill  . acetaminophen (TYLENOL) 325 MG tablet Take 650 mg by mouth every 6 (six) hours as needed for mild pain.      . Prenatal Vit-Fe Fumarate-FA (PRENATAL MULTIVITAMIN) TABS tablet Take 1 tablet by mouth at bedtime.       . sertraline (ZOLOFT) 50 MG tablet Take 50 mg by mouth daily.        Review of Systems - Per HPI above     Physical Exam  Blood pressure 103/68, pulse 93, temperature 98.2 F (36.8 C), temperature source Oral,  resp. rate 18, height 5\' 1"  (1.549 m), weight 78.926 kg (174 lb), last menstrual period 08/17/2013, not currently breastfeeding. GENERAL: Well-developed, well-nourished female in no acute distress.  LUNGS: Clear to auscultation bilaterally.  HEART: Regular rate and rhythm. ABDOMEN: Soft, nontender, nondistended, gravid. Appropriate for gestational age.  EXTREMITIES: Nontender, no edema, 2+ distal pulses. Grossly Neurologically intact.  SPECULUM EXAM: No pooling observed, White discharge noted, No LOF on valsalva.  CERVICAL EXAM: Deferred at this time.   FHT:  Baseline rate 135 bpm   Variability moderate  Accelerations present   Decelerations none Contractions: Irregular Every 12 mins   Labs: No results found for this or any previous visit (from the past 24 hour(s)). 1. Fern Test - Negative  Imaging Studies:  [place] Ob Follow Up  02/08/2014   OBSTETRICAL ULTRASOUND: This exam was performed within a [facility]. The OB US report was generated in the AS system, and faxed to the ordering physician.   This report is available in the [employer] PACS. See the [facility] Obstetric US report via the Image Link.   Assessment: Salem CasterBreanna Wadsworth is  23 y.o. G2P1001 at 6290w6d presents with Leakage of Fluids.  Plan: 1. Concern for Leakage of Fluids IUP @ 7590w6d - Speculum exam WNL with negative pooling observed - Fern test negative - Amnisure negative - Wet prep negative  for Trichomonas,  Yeast or BV - FHT Cat I at this time.   - Reassured pt. That it is unlikely that she has had rupture of membranes at this time. Advised to please return for further leakage, or for any other concern.   Thanks for letting us take care of you!   [place], Hillery Hunter 7/9/20152:40 PM  I spoke with and examined patient and agree with resident's note and plan of care.  Tawana Scale, MD OB Fellow 02/22/2014 4:19 PM

## 2014-02-22 NOTE — MAU Note (Signed)
Urine in lab 

## 2014-02-22 NOTE — MAU Note (Signed)
Noted leakage of fluid around 1300, lasted for about .  Did not wear a pad in, clothing is dry.

## 2014-03-22 ENCOUNTER — Encounter (HOSPITAL_COMMUNITY): Payer: Self-pay | Admitting: *Deleted

## 2014-03-22 ENCOUNTER — Inpatient Hospital Stay (HOSPITAL_COMMUNITY)
Admission: AD | Admit: 2014-03-22 | Discharge: 2014-03-22 | Disposition: A | Discharge status: Home / Self Care | Payer: Medicaid Other | Source: Ambulatory Visit | Attending: Obstetrics & Gynecology | Admitting: Obstetrics & Gynecology

## 2014-03-22 DIAGNOSIS — Z87891 Personal history of nicotine dependence: Secondary | ICD-10-CM | POA: Insufficient documentation

## 2014-03-22 DIAGNOSIS — O4690 Antepartum hemorrhage, unspecified, unspecified trimester: Secondary | ICD-10-CM | POA: Diagnosis present

## 2014-03-22 DIAGNOSIS — M545 Low back pain, unspecified: Secondary | ICD-10-CM | POA: Diagnosis not present

## 2014-03-22 DIAGNOSIS — N72 Inflammatory disease of cervix uteri: Secondary | ICD-10-CM

## 2014-03-22 LAB — WET PREP, GENITAL
Clue Cells Wet Prep HPF POC: NONE SEEN
Trich, Wet Prep: NONE SEEN
Yeast Wet Prep HPF POC: NONE SEEN

## 2014-03-22 MED ORDER — CEFTRIAXONE SODIUM 250 MG IJ SOLR
250.0000 mg | Freq: Once | INTRAMUSCULAR | Status: AC
  Administered 2014-03-22: 250 mg via INTRAMUSCULAR
  Filled 2014-03-22: qty 250

## 2014-03-22 MED ORDER — AZITHROMYCIN 250 MG PO TABS
1000.0000 mg | ORAL_TABLET | Freq: Once | ORAL | Status: AC
Start: 2014-03-22 — End: 2014-03-22
  Administered 2014-03-22: 1000 mg via ORAL
  Filled 2014-03-22: qty 4

## 2014-03-22 NOTE — Discharge Instructions (Signed)
Cervicitis °Cervicitis is a soreness and swelling (inflammation) of the cervix. Your cervix is located at the bottom of your uterus. It opens up to the vagina. °CAUSES  °· Sexually transmitted infections (STIs).   °· Allergic reaction.   °· Medicines or birth control devices that are put in the vagina.   °· Injury to the cervix.   °· Bacterial infections.   °RISK FACTORS °You are at greater risk if you: °· Have unprotected sexual intercourse. °· Have sexual intercourse with many partners. °· Began sexual intercourse at an early age. °· Have a history of STIs. °SYMPTOMS  °There may be no symptoms. If symptoms occur, they may include:  °· Gray, white, yellow, or bad-smelling vaginal discharge.   °· Pain or itching of the area outside the vagina.   °· Painful sexual intercourse.   °· Lower abdominal or lower back pain, especially during intercourse.   °· Frequent urination.   °· Abnormal vaginal bleeding between periods, after sexual intercourse, or after menopause.   °· Pressure or a heavy feeling in the pelvis.   °DIAGNOSIS  °Diagnosis is made after a pelvic exam. Other tests may include:  °· Examination of any discharge under a microscope (wet prep).   °· A Pap test.   °TREATMENT  °Treatment will depend on the cause of cervicitis. If it is caused by an STI, both you and your partner will need to be treated. Antibiotic medicines will be given.  °HOME CARE INSTRUCTIONS  °· Do not have sexual intercourse until your health care provider says it is okay.   °· Do not have sexual intercourse until your partner has been treated, if your cervicitis is caused by an STI.   °· Take your antibiotics as directed. Finish them even if you start to feel better.   °SEEK MEDICAL CARE IF: °· Your symptoms come back.   °· You have a fever.   °MAKE SURE YOU:  °· Understand these instructions. °· Will watch your condition. °· Will get help right away if you are not doing well or get worse. °Document Released: 08/03/2005 Document Revised:  08/08/2013 Document Reviewed: 01/25/2013 °ExitCare® Patient Information ©2015 ExitCare, LLC. This information is not intended to replace advice given to you by your health care provider. Make sure you discuss any questions you have with your health care provider. ° °

## 2014-03-22 NOTE — MAU Note (Signed)
Gush of bright red blood about an hour before coming to MAU. States still bleeding but not as much. Low back pain since bleeding, constant. Feeling some abdominal tightening but no pain. Denies complications with this pregnancy. No fetal movement in 4 hours.

## 2014-03-22 NOTE — MAU Provider Note (Signed)
History     CSN: [id number]  Arrival date and time: 03/22/14 2021   None     Chief Complaint  Patient presents with  . Vaginal Bleeding  . Back Pain   HPI  23 year old G2P1001 at [redacted]w[redacted]d gestation who presents with an episode of vaginal bleeding. Patient noticed at 1600 hours that she had a sensation of dripping from her vagina, checked her underpants and saw a small amount of bright red blood but no clots. Bleeding did not continue, but she noticed some small streak of red blood when she wiped after urination. No recent abdominal trauma. No bleeding tendency. She had some small amount of vaginal bleeding in her previous pregnancy that was never diagnosed as anything complicated. No contractions, no vaginal discharge. She has positive fetal movement. No fevers. No hematuria. No recent sexual contact. No history of abnormal pap smears. No previa on ultrasound.  Past Medical History  Diagnosis Date  . Depression     Past Surgical History  Procedure Laterality Date  . No past surgeries      History reviewed. No pertinent family history.  History  Substance Use Topics  . Smoking status: Former Smoker    Quit date: 07/23/2013  . Smokeless tobacco: Never Used  . Alcohol Use: No    Allergies:  Allergies  Allergen Reactions  . Penicillins Hives    Prescriptions prior to admission  Medication Sig Dispense Refill  . acetaminophen (TYLENOL) 325 MG tablet Take 650 mg by mouth every 6 (six) hours as needed for mild pain.      . Prenatal Vit-Fe Fumarate-FA (PRENATAL MULTIVITAMIN) TABS tablet Take 1 tablet by mouth at bedtime.       . sertraline (ZOLOFT) 50 MG tablet Take 50 mg by mouth daily.        Review of Systems  Constitutional: Negative for fever and chills.  HENT: Negative for hearing loss.   Eyes: Negative for blurred vision and double vision.  Respiratory: Negative for cough and shortness of breath.   Cardiovascular: Negative for chest pain and palpitations.    Gastrointestinal: Negative for heartburn, nausea, vomiting, abdominal pain, diarrhea and constipation.  Genitourinary: Negative for dysuria and hematuria.  Musculoskeletal: Negative for myalgias.  Skin: Negative for itching and rash.  Neurological: Negative for dizziness, tingling and headaches.  Endo/Heme/Allergies: Does not bruise/bleed easily.   Physical Exam   Blood pressure 111/68, pulse 92, temperature 98.4 F (36.9 C), temperature source Oral, resp. rate 18, height 5\' 3"  (1.6 m), weight 81.375 kg (179 lb 6.4 oz), last menstrual period 08/17/2013, SpO2 99.00%, not currently breastfeeding.  Physical Exam  Constitutional: She is oriented to person, place, and time. She appears well-developed and well-nourished.  Cardiovascular: Normal rate and regular rhythm.   Respiratory: Effort normal and breath sounds normal.  GI: Soft. Bowel sounds are normal. She exhibits no distension. There is no tenderness.  Genitourinary:    Musculoskeletal: She exhibits no edema and no tenderness.  Neurological: She is alert and oriented to person, place, and time.  Skin: Skin is warm and dry.   Cervix has region of multiple small hemorrhages with surrounding erythema and inflammatory changes MAU Course  Procedures  MDM Speculum exam, wet prep, GC chlamydia testing.  Assessment and Plan  Patient appears to have symptoms of cervicitis. No bleeding from cervical OS. Very minimal blood mixed with discharge in the vaginal vault. Cervix closed, no signs of active labor, no abdominal tenderness on exam. Awaiting GC chlamydia. Treat empirically  with Rocephin 250mg  IM and azithromycin 1 g. Followup in 1 week with prenatal provider.  Patient seen with Sheria LangJames Arnold  Jayveon Convey A 03/22/2014, 9:30 PM

## 2014-03-23 LAB — GC/CHLAMYDIA PROBE AMP
CT Probe RNA: NEGATIVE
GC PROBE AMP APTIMA: NEGATIVE

## 2014-05-04 LAB — OB RESULTS CONSOLE GC/CHLAMYDIA
Chlamydia: NEGATIVE
Gonorrhea: NEGATIVE

## 2014-05-04 LAB — OB RESULTS CONSOLE GBS: STREP GROUP B AG: NEGATIVE

## 2014-05-24 ENCOUNTER — Encounter (HOSPITAL_COMMUNITY): Payer: Self-pay | Admitting: *Deleted

## 2014-05-24 ENCOUNTER — Inpatient Hospital Stay (HOSPITAL_COMMUNITY)
Admission: AD | Admit: 2014-05-24 | Discharge: 2014-05-24 | Disposition: A | Discharge status: Home / Self Care | Payer: Medicaid Other | Source: Ambulatory Visit | Attending: Obstetrics & Gynecology | Admitting: Obstetrics & Gynecology

## 2014-05-24 DIAGNOSIS — O471 False labor at or after 37 completed weeks of gestation: Secondary | ICD-10-CM | POA: Insufficient documentation

## 2014-05-24 DIAGNOSIS — Z3A38 38 weeks gestation of pregnancy: Secondary | ICD-10-CM | POA: Insufficient documentation

## 2014-05-24 NOTE — MAU Note (Signed)
PT  SAYS SHE GETS PNC - AT [facility]-   SEEN LAST ON Friday-  NO VE.    SAYS HURT BAD  SINCE 5 PM.    DENIES HSV AND MRSA.   GBS- NEG

## 2014-05-24 NOTE — MAU Note (Signed)
Contractions tonight. Denies LOF or vaginal bleeding. Positive fetal movement.  

## 2014-05-25 ENCOUNTER — Inpatient Hospital Stay (HOSPITAL_COMMUNITY)
Admission: AD | Admit: 2014-05-25 | Discharge: 2014-05-25 | Disposition: A | Discharge status: Home / Self Care | Payer: Medicaid Other | Source: Ambulatory Visit | Attending: Family Medicine | Admitting: Family Medicine

## 2014-05-25 ENCOUNTER — Encounter (HOSPITAL_COMMUNITY): Payer: Self-pay | Admitting: *Deleted

## 2014-05-25 DIAGNOSIS — O471 False labor at or after 37 completed weeks of gestation: Secondary | ICD-10-CM | POA: Insufficient documentation

## 2014-05-25 DIAGNOSIS — Z3A39 39 weeks gestation of pregnancy: Secondary | ICD-10-CM | POA: Insufficient documentation

## 2014-05-25 NOTE — MAU Note (Signed)
Patient states she is having contractions every 5 minutes with bloody show. Reports good fetal movement.

## 2014-05-26 ENCOUNTER — Inpatient Hospital Stay (HOSPITAL_COMMUNITY)
Admission: AD | Admit: 2014-05-26 | Discharge: 2014-05-28 | DRG: 775 | Disposition: A | Discharge status: Home / Self Care | Payer: Medicaid Other | Source: Ambulatory Visit | Attending: Family Medicine | Admitting: Family Medicine

## 2014-05-26 ENCOUNTER — Encounter (HOSPITAL_COMMUNITY): Payer: Self-pay

## 2014-05-26 ENCOUNTER — Inpatient Hospital Stay (HOSPITAL_COMMUNITY): Payer: Medicaid Other | Admitting: Anesthesiology

## 2014-05-26 ENCOUNTER — Encounter (HOSPITAL_COMMUNITY): Payer: Medicaid Other | Admitting: Anesthesiology

## 2014-05-26 DIAGNOSIS — Z823 Family history of stroke: Secondary | ICD-10-CM

## 2014-05-26 DIAGNOSIS — Z833 Family history of diabetes mellitus: Secondary | ICD-10-CM

## 2014-05-26 DIAGNOSIS — Z87891 Personal history of nicotine dependence: Secondary | ICD-10-CM

## 2014-05-26 DIAGNOSIS — Z8249 Family history of ischemic heart disease and other diseases of the circulatory system: Secondary | ICD-10-CM

## 2014-05-26 DIAGNOSIS — Z3483 Encounter for supervision of other normal pregnancy, third trimester: Secondary | ICD-10-CM | POA: Diagnosis present

## 2014-05-26 DIAGNOSIS — IMO0001 Reserved for inherently not codable concepts without codable children: Secondary | ICD-10-CM

## 2014-05-26 DIAGNOSIS — Z3A39 39 weeks gestation of pregnancy: Secondary | ICD-10-CM | POA: Diagnosis present

## 2014-05-26 LAB — CBC
HEMATOCRIT: 36.4 % (ref 36.0–46.0)
Hemoglobin: 12.1 g/dL (ref 12.0–15.0)
MCH: 28.8 pg (ref 26.0–34.0)
MCHC: 33.2 g/dL (ref 30.0–36.0)
MCV: 86.7 fL (ref 78.0–100.0)
Platelets: 158 10*3/uL (ref 150–400)
RBC: 4.2 MIL/uL (ref 3.87–5.11)
RDW: 15.5 % (ref 11.5–15.5)
WBC: 9.1 10*3/uL (ref 4.0–10.5)

## 2014-05-26 LAB — POCT FERN TEST: POCT Fern Test: POSITIVE

## 2014-05-26 LAB — RPR

## 2014-05-26 MED ORDER — LACTATED RINGERS IV SOLN
INTRAVENOUS | Status: DC
  Administered 2014-05-26 (×2): via INTRAVENOUS

## 2014-05-26 MED ORDER — EPHEDRINE 5 MG/ML INJ
10.0000 mg | INTRAVENOUS | Status: DC | PRN
  Filled 2014-05-26: qty 2

## 2014-05-26 MED ORDER — ONDANSETRON HCL 4 MG/2ML IJ SOLN
4.0000 mg | INTRAMUSCULAR | Status: DC | PRN
Start: 2014-05-26 — End: 2014-05-28

## 2014-05-26 MED ORDER — CITRIC ACID-SODIUM CITRATE 334-500 MG/5ML PO SOLN: 30.0000 mL | ORAL | Status: DC | PRN

## 2014-05-26 MED ORDER — DIPHENHYDRAMINE HCL 50 MG/ML IJ SOLN: 12.5000 mg | INTRAMUSCULAR | Status: DC | PRN

## 2014-05-26 MED ORDER — OXYTOCIN BOLUS FROM INFUSION: 500.0000 mL | INTRAVENOUS | Status: DC

## 2014-05-26 MED ORDER — WITCH HAZEL-GLYCERIN EX PADS: 1.0000 "application " | MEDICATED_PAD | CUTANEOUS | Status: DC | PRN

## 2014-05-26 MED ORDER — OXYCODONE-ACETAMINOPHEN 5-325 MG PO TABS
1.0000 | ORAL_TABLET | ORAL | Status: DC | PRN
  Administered 2014-05-27 – 2014-05-28 (×3): 1 via ORAL
  Filled 2014-05-26 (×3): qty 1

## 2014-05-26 MED ORDER — ZOLPIDEM TARTRATE 5 MG PO TABS: 5.0000 mg | ORAL_TABLET | Freq: Every evening | ORAL | Status: DC | PRN

## 2014-05-26 MED ORDER — LANOLIN HYDROUS EX OINT: TOPICAL_OINTMENT | CUTANEOUS | Status: DC | PRN

## 2014-05-26 MED ORDER — SIMETHICONE 80 MG PO CHEW: 80.0000 mg | CHEWABLE_TABLET | ORAL | Status: DC | PRN

## 2014-05-26 MED ORDER — ONDANSETRON HCL 4 MG/2ML IJ SOLN: 4.0000 mg | Freq: Four times a day (QID) | INTRAMUSCULAR | Status: DC | PRN

## 2014-05-26 MED ORDER — INFLUENZA VAC SPLIT QUAD 0.5 ML IM SUSY
0.5000 mL | PREFILLED_SYRINGE | INTRAMUSCULAR | Status: AC
  Administered 2014-05-26: 0.5 mL via INTRAMUSCULAR

## 2014-05-26 MED ORDER — PRENATAL MULTIVITAMIN CH
1.0000 | ORAL_TABLET | Freq: Every day | ORAL | Status: DC
Start: 2014-05-27 — End: 2014-05-28
  Administered 2014-05-27 – 2014-05-28 (×2): 1 via ORAL
  Filled 2014-05-26 (×2): qty 1

## 2014-05-26 MED ORDER — IBUPROFEN 600 MG PO TABS
600.0000 mg | ORAL_TABLET | ORAL | Status: DC | PRN
  Administered 2014-05-26: 600 mg via ORAL
  Filled 2014-05-26: qty 1

## 2014-05-26 MED ORDER — FENTANYL 2.5 MCG/ML BUPIVACAINE 1/10 % EPIDURAL INFUSION (WH - ANES)
14.0000 mL/h | INTRAMUSCULAR | Status: DC | PRN
  Administered 2014-05-26: 14 mL/h via EPIDURAL
  Filled 2014-05-26: qty 125

## 2014-05-26 MED ORDER — FENTANYL 2.5 MCG/ML BUPIVACAINE 1/10 % EPIDURAL INFUSION (WH - ANES)
INTRAMUSCULAR | Status: DC | PRN
  Administered 2014-05-26: 14 mL/h via EPIDURAL

## 2014-05-26 MED ORDER — LACTATED RINGERS IV SOLN: 500.0000 mL | Freq: Once | INTRAVENOUS | Status: DC

## 2014-05-26 MED ORDER — LIDOCAINE HCL (PF) 1 % IJ SOLN
30.0000 mL | INTRAMUSCULAR | Status: DC | PRN
  Filled 2014-05-26: qty 30

## 2014-05-26 MED ORDER — LIDOCAINE HCL (PF) 1 % IJ SOLN
INTRAMUSCULAR | Status: DC | PRN
  Administered 2014-05-26 (×2): 5 mL
  Administered 2014-05-26: 3 mL

## 2014-05-26 MED ORDER — SERTRALINE HCL 50 MG PO TABS
50.0000 mg | ORAL_TABLET | Freq: Every day | ORAL | Status: DC
  Administered 2014-05-26 – 2014-05-27 (×2): 50 mg via ORAL
  Filled 2014-05-26 (×3): qty 1

## 2014-05-26 MED ORDER — OXYTOCIN 40 UNITS IN LACTATED RINGERS INFUSION - SIMPLE MED
1.0000 m[IU]/min | INTRAVENOUS | Status: DC
  Administered 2014-05-26: 2 m[IU]/min via INTRAVENOUS
  Filled 2014-05-26: qty 1000

## 2014-05-26 MED ORDER — SERTRALINE HCL 50 MG PO TABS
50.0000 mg | ORAL_TABLET | Freq: Every day | ORAL | Status: DC
  Filled 2014-05-26 (×2): qty 1

## 2014-05-26 MED ORDER — PHENYLEPHRINE 40 MCG/ML (10ML) SYRINGE FOR IV PUSH (FOR BLOOD PRESSURE SUPPORT)
80.0000 ug | PREFILLED_SYRINGE | INTRAVENOUS | Status: DC | PRN
  Filled 2014-05-26: qty 2

## 2014-05-26 MED ORDER — TERBUTALINE SULFATE 1 MG/ML IJ SOLN: 0.2500 mg | Freq: Once | INTRAMUSCULAR | Status: DC | PRN

## 2014-05-26 MED ORDER — FLEET ENEMA 7-19 GM/118ML RE ENEM: 1.0000 | ENEMA | RECTAL | Status: DC | PRN

## 2014-05-26 MED ORDER — PHENYLEPHRINE 40 MCG/ML (10ML) SYRINGE FOR IV PUSH (FOR BLOOD PRESSURE SUPPORT)
80.0000 ug | PREFILLED_SYRINGE | INTRAVENOUS | Status: DC | PRN
  Administered 2014-05-26 (×2): 40 ug via INTRAVENOUS
  Filled 2014-05-26: qty 10
  Filled 2014-05-26: qty 2

## 2014-05-26 MED ORDER — ONDANSETRON HCL 4 MG PO TABS: 4.0000 mg | ORAL_TABLET | ORAL | Status: DC | PRN

## 2014-05-26 MED ORDER — OXYCODONE-ACETAMINOPHEN 5-325 MG PO TABS: 2.0000 | ORAL_TABLET | ORAL | Status: DC | PRN

## 2014-05-26 MED ORDER — TETANUS-DIPHTH-ACELL PERTUSSIS 5-2.5-18.5 LF-MCG/0.5 IM SUSP: 0.5000 mL | Freq: Once | INTRAMUSCULAR | Status: DC

## 2014-05-26 MED ORDER — OXYCODONE-ACETAMINOPHEN 5-325 MG PO TABS: 1.0000 | ORAL_TABLET | ORAL | Status: DC | PRN

## 2014-05-26 MED ORDER — OXYTOCIN 40 UNITS IN LACTATED RINGERS INFUSION - SIMPLE MED: 62.5000 mL/h | INTRAVENOUS | Status: DC

## 2014-05-26 MED ORDER — DIBUCAINE 1 % RE OINT: 1.0000 "application " | TOPICAL_OINTMENT | RECTAL | Status: DC | PRN

## 2014-05-26 MED ORDER — ACETAMINOPHEN 325 MG PO TABS: 650.0000 mg | ORAL_TABLET | ORAL | Status: DC | PRN

## 2014-05-26 MED ORDER — DIPHENHYDRAMINE HCL 25 MG PO CAPS: 25.0000 mg | ORAL_CAPSULE | Freq: Four times a day (QID) | ORAL | Status: DC | PRN

## 2014-05-26 MED ORDER — SENNOSIDES-DOCUSATE SODIUM 8.6-50 MG PO TABS
2.0000 | ORAL_TABLET | ORAL | Status: DC
  Administered 2014-05-26 – 2014-05-27 (×2): 2 via ORAL
  Filled 2014-05-26 (×2): qty 2

## 2014-05-26 MED ORDER — IBUPROFEN 600 MG PO TABS
600.0000 mg | ORAL_TABLET | Freq: Four times a day (QID) | ORAL | Status: DC
  Administered 2014-05-26 – 2014-05-28 (×6): 600 mg via ORAL
  Filled 2014-05-26 (×7): qty 1

## 2014-05-26 MED ORDER — LACTATED RINGERS IV SOLN
500.0000 mL | INTRAVENOUS | Status: DC | PRN
  Administered 2014-05-26: 500 mL via INTRAVENOUS

## 2014-05-26 MED ORDER — BENZOCAINE-MENTHOL 20-0.5 % EX AERO
1.0000 "application " | INHALATION_SPRAY | CUTANEOUS | Status: DC | PRN
  Administered 2014-05-26: 1 via TOPICAL
  Filled 2014-05-26: qty 56

## 2014-05-26 NOTE — Progress Notes (Signed)
Ann CasterBreanna Romero is a 23 y.o. G2P1001 at 2668w1d  admitted for active labor  Subjective: More pressure in lower back. No other complaints  Objective: Filed Vitals:   05/26/14 1101 05/26/14 1116 05/26/14 1131 05/26/14 1201  BP: 103/62 121/60 109/70 109/87  Pulse: 76 84 92 138  Temp:   98.3 F (36.8 C)   TempSrc:   Oral   Resp: 18 18 18    Height:      Weight:      SpO2:          FHT:  FHR: 150 bpm, variability: moderate,  accelerations:  Present,  decelerations:  Absent UC:   regular, every 3-5 minutes SVE:   Dilation: 7 Effacement (%): 80 Station: -1 Exam by:: kim booker cnm Pitocin @ 8 mu/min  Labs: Lab Results  Component Value Date   WBC 9.1 05/26/2014   HGB 12.1 05/26/2014   HCT 36.4 05/26/2014   MCV 86.7 05/26/2014   PLT 158 05/26/2014    Assessment / Plan: Augmentation of labor, progressing well on pitocin  Labor: Progressing normally Fetal Wellbeing:  Category I Pain Control:  Epidural Anticipated MOD:  NSVD  [name], [name] 05/26/2014, 12:47 PM

## 2014-05-26 NOTE — MAU Note (Signed)
Contractions with some bloody show and back pain

## 2014-05-26 NOTE — Anesthesia Procedure Notes (Signed)
Epidural Patient location during procedure: OB  Staffing Anesthesiologist: Latresa Gasser Performed by: anesthesiologist   Preanesthetic Checklist Completed: patient identified, site marked, surgical consent, pre-op evaluation, timeout performed, IV checked, risks and benefits discussed and monitors and equipment checked  Epidural Patient position: sitting Prep: ChloraPrep Patient monitoring: heart rate, continuous pulse ox and blood pressure Approach: right paramedian Location: L3-L4 Injection technique: LOR saline  Needle:  Needle type: Tuohy  Needle gauge: 17 G Needle length: 9 cm and 9 Needle insertion depth: 6 cm Catheter type: closed end flexible Catheter size: 20 Guage Catheter at skin depth: 11 cm Test dose: negative  Assessment Events: blood not aspirated, injection not painful, no injection resistance, negative IV test and no paresthesia  Additional Notes   Patient tolerated the insertion well without complications.   

## 2014-05-26 NOTE — Lactation Note (Signed)
This note was copied from the chart of Ann Romero [place]. Lactation Consultation Note  Patient Name: Ann Romero Mothershead ZOXWR'UToday's Date: 05/26/2014 Reason for consult: Follow-up assessment Baby 8 hours of life. Mom reports difficulty getting baby latched since birth. Baby very fussy after 1-2 attempts at latching. LC can latch baby, but baby does not stay on breast and mom cannot latch baby. Fitted mom with a #20 NS, discussed with mom that it is a temporary solution. Enc mom to continue to attempt to latch baby directly at breast, especially after baby used NS for a few minutes and nipple is more everted. Mom is able to express colostrum readily. Mom states that her milk came in with first child. Enc mom to feed with cues and offer lots of STS. Enc mom to be patient with latching baby and keep baby deeply latched.   Maternal Data Has patient been taught Hand Expression?: Yes Does the patient have breastfeeding experience prior to this delivery?: Yes  Feeding Feeding Type: Breast Fed Length of feed:  (Assessed first 10 minutes of BF.)  LATCH Score/Interventions Latch: Grasps breast easily, tongue down, lips flanged, rhythmical sucking. Intervention(s): Adjust position;Assist with latch;Breast compression  Audible Swallowing: A few with stimulation  Type of Nipple: Flat Intervention(s): Hand pump  Comfort (Breast/Nipple): Soft / non-tender     Hold (Positioning): No assistance needed to correctly position infant at breast. Intervention(s): Breastfeeding basics reviewed;Support Pillows;Position options  LATCH Score: 8  Lactation Tools Discussed/Used Tools: Nipple Shields Nipple shield size: 20   Consult Status Consult Status: Follow-up Date: 05/27/14 Follow-up type: In-patient    Geralynn OchsWILLIARD, Ahnesti Townsend 05/26/2014, 11:13 PM

## 2014-05-26 NOTE — H&P (Signed)
Ann Romero is a 23 y.o. female at 7045w1d by 9034w1d U/S presenting for onset of labor and SROM.  Maternal Medical History:  Reason for admission: Rupture of membranes and contractions.  Nausea.  Contractions: Onset was 6-12 hours ago.   Frequency: regular.   Perceived severity is moderate.    Fetal activity: Perceived fetal activity is normal.   Last perceived fetal movement was within the past hour.    Prenatal Complications - Diabetes: none.    Contractions started around midnight, regular every 3-5 minutes but continued and got stronger, came to MAU to be evaluated. While in the MAU, had SROM with clear fluid. +VB (spotting), +FM. Denies HA, CP, SOB, changes in vision, RUQ pain, leg swelling.   Prenatal course: care at [facility], no complications. Normal quad on 12/18/13. 1hr GTT 84.  OB History   Grav Para Term Preterm Abortions TAB SAB Ect Mult Living   2 1 1       1      Past Medical History  Diagnosis Date  . Depression    Past Surgical History  Procedure Laterality Date  . No past surgeries     Family History: family history includes Diabetes in her maternal grandmother; Hypertension in her maternal grandfather; Stroke in her maternal aunt. Social History:  reports that she quit smoking about 10 months ago. She has never used smokeless tobacco. She reports that she does not drink alcohol or use illicit drugs.   Prenatal Transfer Tool  Maternal Diabetes: No Genetic Screening: Normal Maternal Ultrasounds/Referrals: Normal Fetal Ultrasounds or other Referrals:  None Maternal Substance Abuse:  No Significant Maternal Medications:  None Significant Maternal Lab Results:  None Other Comments:  None  Review of Systems  Constitutional: Negative for fever and chills.  Eyes: Negative for blurred vision and double vision.  Respiratory: Negative for shortness of breath.   Cardiovascular: Negative for chest pain.  Gastrointestinal: Negative for nausea, vomiting and abdominal  pain.  Genitourinary: Negative for dysuria.  Neurological: Negative for dizziness and headaches.  All other systems reviewed and are negative.   Dilation: 4.5 Effacement (%): 70 Station: -2 Exam by:: Benji StanleyRN Blood pressure 117/71, pulse 97, temperature 98.3 F (36.8 C), temperature source Oral, resp. rate 18, height 5\' 3"  (1.6 m), weight 87.091 kg (192 lb), last menstrual period 08/17/2013, not currently breastfeeding. Maternal Exam:  Abdomen: Fetal presentation: vertex  Introitus: Ferning test: positive.  Amniotic fluid character: clear.  Pelvis: adequate for delivery.      Fetal Exam Fetal Monitor Review: Mode: ultrasound.   Baseline rate: 150.  Variability: moderate (6-25 bpm).   Pattern: accelerations present and no decelerations.    Fetal State Assessment: Category I - tracings are normal.     Physical Exam  Nursing note and vitals reviewed. Constitutional: She is oriented to person, place, and time. She appears well-developed and well-nourished.  HENT:  Head: Normocephalic and atraumatic.  Cardiovascular: Normal rate, regular rhythm, normal heart sounds and intact distal pulses.  Exam reveals no gallop and no friction rub.   No murmur heard. Respiratory: Effort normal and breath sounds normal. No respiratory distress. She has no wheezes.  GI: Soft. There is no tenderness.  Musculoskeletal: She exhibits no edema and no tenderness.  Neurological: She is alert and oriented to person, place, and time.  Skin: Skin is warm and dry.  Psychiatric: She has a normal mood and affect. Her behavior is normal.    Prenatal labs: ABO, Rh: O/Positive/-- (04/02 0000) Antibody: Negative (  04/02 0000) Rubella: Immune (04/02 0000) RPR: Nonreactive (04/02 0000)  HBsAg: Negative (04/02 0000)  HIV: Non-reactive (04/02 0000)  GBS: Negative (09/18 0000)   Assessment/Plan: Ann Romero is a 23 y.o. G2P1001 at 75121w1d by 9421w1d U/S here for onset of labor, SROM  #Labor:  expectant management #Pain: epidural #FWB: Cat 1 #ID:  GBS neg #MOF: breast #MOC:undecided #Circ:  Yes, outpatient   Tawni CarnesWight, Andrew 05/26/2014, 8:00 AM    I spoke with and examined patient and agree with resident/PA/SNM's note and plan of care.  Cheral MarkerKimberly R. Trequan Marsolek, CNM, WHNP-BC 05/26/2014 10:03 AM

## 2014-05-26 NOTE — Anesthesia Preprocedure Evaluation (Signed)
Anesthesia Evaluation  Patient identified by MRN, date of birth, ID band Patient awake    Reviewed: Allergy & Precautions, H&P , NPO status , Patient's Chart, lab work & pertinent test results  History of Anesthesia Complications Negative for: history of anesthetic complications  Airway Mallampati: II TM Distance: >3 FB Neck ROM: full    Dental no notable dental hx. (+) Teeth Intact   Pulmonary neg pulmonary ROS, former smoker,  breath sounds clear to auscultation  Pulmonary exam normal       Cardiovascular negative cardio ROS  Rhythm:regular Rate:Normal     Neuro/Psych negative neurological ROS  negative psych ROS   GI/Hepatic negative GI ROS, Neg liver ROS,   Endo/Other  negative endocrine ROS  Renal/GU negative Renal ROS  negative genitourinary   Musculoskeletal   Abdominal Normal abdominal exam  (+)   Peds  Hematology negative hematology ROS (+)   Anesthesia Other Findings   Reproductive/Obstetrics (+) Pregnancy                           Anesthesia Physical Anesthesia Plan  ASA: II  Anesthesia Plan: Epidural   Post-op Pain Management:    Induction:   Airway Management Planned:   Additional Equipment:   Intra-op Plan:   Post-operative Plan:   Informed Consent: I have reviewed the patients History and Physical, chart, labs and discussed the procedure including the risks, benefits and alternatives for the proposed anesthesia with the patient or authorized representative who has indicated his/her understanding and acceptance.     Plan Discussed with:   Anesthesia Plan Comments:         Anesthesia Quick Evaluation  

## 2014-05-26 NOTE — Progress Notes (Signed)
Ann Romero is a 23 y.o. G2P1001 at 4478w1d  admitted for active labor  Subjective: Comfortable with epidural in place.   Objective: Filed Vitals:   05/26/14 0916 05/26/14 0918 05/26/14 0921 05/26/14 0922  BP: 115/67  88/44   Pulse: 108 130 104 106  Temp:      TempSrc:      Resp:      Height:      Weight:      SpO2:  99% 99%       FHT:  FHR: 150 bpm, variability: moderate,  accelerations:  Present,  decelerations:  Absent UC:   regular, every 4-5 minutes SVE:   Dilation: 4.5 Effacement (%): 70 Station: -2 Exam by:: Benji StanleyRN No Pitocin   Labs: Lab Results  Component Value Date   WBC 9.1 05/26/2014   HGB 12.1 05/26/2014   HCT 36.4 05/26/2014   MCV 86.7 05/26/2014   PLT 158 05/26/2014    Assessment / Plan: SOL, SROM  Labor: Protracted early labor, will proceed with pitocin augmentation Fetal Wellbeing:  Category I Pain Control:  Epidural Anticipated MOD:  NSVD  [name], Karleen Seebeck 05/26/2014, 9:38 AM

## 2014-05-26 NOTE — H&P (Signed)
Attestation of Attending Supervision of Advanced Practitioner (PA/CNM/NP): Evaluation and management procedures were performed by the Advanced Practitioner under my supervision and collaboration.  I have reviewed the Advanced Practitioner's note and chart, and I agree with the management and plan.  Gennesis Hogland, DO Attending Physician Faculty Practice, [facility]  

## 2014-05-26 NOTE — Lactation Note (Signed)
This note was copied from the chart of Boy [place] CasterBreanna Phifer. Lactation Consultation Note  Patient Name: Boy [place] CasterBreanna Lyons [id number]'s Date: 05/26/2014 Reason for consult: Initial assessment  Baby 7 hours of life. Baby getting first bath. Enc parents to [facility] and mom giving STS for assist with latching baby at breast. Mom given [facility], aware of OP/BFSG, community resources and [facility] phone [facility] services.  Maternal Data Does the patient have breastfeeding experience prior to this delivery?: No  Feeding Feeding Type:  (Baby being bathed, asked parents to call for assistance [facility] after bath and in bed giving STS.) Length of feed: 0 min (parents said sucked for 3-4 seconds only)  LATCH Score/Interventions                      Lactation Tools Discussed/Used     Consult Status Consult Status: Follow-up Date: 05/27/14 Follow-up type: In-patient    Geralynn OchsWILLIARD, Akili Corsetti 05/26/2014, 10:13 PM

## 2014-05-27 NOTE — Progress Notes (Signed)
Post Partum Day 1 Subjective: Eating, drinking, voiding, ambulating well.  +flatus.  Lochia and pain wnl.  Denies dizziness, lightheadedness, or sob. No complaints. May be interested in going home later today, isn't sure yet.   Objective: Blood pressure 110/57, pulse 103, temperature 98.2 F (36.8 C), temperature source Oral, resp. rate 20, height 5\' 3"  (1.6 m), weight 87.091 kg (192 lb), last menstrual period 08/17/2013, SpO2 100.00%, unknown if currently breastfeeding.  Physical Exam:  General: alert, cooperative and no distress Lochia: appropriate Uterine Fundus: firm Incision: n/a DVT Evaluation: No evidence of DVT seen on physical exam. Negative Homan's sign. No cords or calf tenderness. No significant calf/ankle edema.   Recent Labs  05/26/14 0725  HGB 12.1  HCT 36.4    Assessment/Plan: Breastfeeding well, possible d/c this afternoon- will let RN know if she decides to go, plans Mirena for contraception, OP circ   LOS: 1 day   Ann Romero, Ann Romero 05/27/2014, 7:52 AM

## 2014-05-27 NOTE — Progress Notes (Signed)

## 2014-05-27 NOTE — Anesthesia Postprocedure Evaluation (Signed)
Anesthesia Post Note  Patient: Ann Romero  Procedure(s) Performed: * No procedures listed *  Anesthesia type: Epidural  Patient location: Mother/Baby  Post pain: Pain level controlled  Post assessment: Post-op Vital signs reviewed  Last Vitals:  Filed Vitals:   05/27/14 0643  BP: 110/57  Pulse: 103  Temp: 36.8 C  Resp: 20    Post vital signs: Reviewed  Level of consciousness:alert  Complications: No apparent anesthesia complications

## 2014-05-27 NOTE — Lactation Note (Addendum)
This note was copied from the chart of Ann Romero [place]. Lactation Consultation Note  Patient Name: Ann Romero Puzio ZOXWR'UToday's Date: 05/27/2014 Reason for consult: Follow-up assessment;Difficult latch Baby 29 hours of life. Patient's MBU RN Tresa EndoKelly asked LC to assess latch. Mom states she has been latching baby fine. Mom states that using the hand pump prior to latching helps to ever nipple prior to baby latching on. Mom states that she does have about 20 seconds of discomfort at the nipple at the beginning of BF, but this subsides. Discussed that this normal. CGs with instruction given. Explained that baby's stretching nipple while suckling to evert more. Mom not able to latch baby while LC in room, LC latched baby without trouble. Demonstrated to mom how LC used a teacup hold until baby suckling rhythmically. Mom states that she is able to latch baby it just takes a few minutes. FOB states that if baby gets frustrated while attempting to latch, they console baby and then re-attempt. Baby BF for 20 minutes, suckling rhythmically with intermittent swallows noted. When baby came off nipple, nipple round and everted well. Baby appears satisfied. Enc mom to nurse with cues. Also enc FOB to wash hand pump after each use as there was dried colostrum in the hand pump. Discussed assessment with patient's MBU evening nurse Porfirio Mylararmen. Enc mom to ask for assistance with latching as mom admits that it takes her a few minutes to get baby latched and LC did not see mom able to latch baby.  Enc mom to continue to support baby's back/neck while nursing, but mom attempting to leave baby propped up on blanket. Explained to mom that baby needs her assistance to support his head.   Maternal Data    Feeding Feeding Type: Breast Fed Length of feed: 10 min  LATCH Score/Interventions Latch: Repeated attempts needed to sustain latch, nipple held in mouth throughout feeding, stimulation needed to elicit sucking  reflex. Intervention(s): Skin to skin Intervention(s): Assist with latch;Adjust position  Audible Swallowing: Spontaneous and intermittent  Type of Nipple: Flat Intervention(s): No intervention needed  Comfort (Breast/Nipple): Filling, red/small blisters or bruises, mild/mod discomfort (mild discomfort first 20 seconds of BF.)  Problem noted: Mild/Moderate discomfort Interventions (Mild/moderate discomfort): Comfort gels  Hold (Positioning): Assistance needed to correctly position infant at breast and maintain latch.  LATCH Score: 6  Lactation Tools Discussed/Used     Consult Status Consult Status: Follow-up Date: 05/28/14 Follow-up type: In-patient    Geralynn OchsWILLIARD, Danuta Huseman 05/27/2014, 7:56 PM

## 2014-05-28 MED ORDER — IBUPROFEN 600 MG PO TABS: 600.0000 mg | ORAL_TABLET | ORAL | Status: DC | PRN

## 2014-05-28 MED ORDER — DOCUSATE SODIUM 100 MG PO CAPS: 100.0000 mg | ORAL_CAPSULE | Freq: Two times a day (BID) | ORAL | Status: AC

## 2014-05-28 NOTE — Discharge Instructions (Signed)
Outpatient circumcision: [facility] [phone] - $175, needs to be done within 2 weeks of birth [facility] [phone] - [phone], [facility] to [facility] within [facility] weeks [facility] [phone] - $250, needs to be done within 1 week of birth

## 2014-05-28 NOTE — Discharge Summary (Signed)
Obstetric Discharge Summary Reason for Admission: onset of labor and rupture of membranes Prenatal Procedures: none Intrapartum Procedures: spontaneous vaginal delivery Postpartum Procedures: none Complications-Operative and Postpartum: 1st degree perineal laceration Hemoglobin  Date Value Ref Range Status  05/26/2014 12.1  12.0 - 15.0 g/dL Final     HCT  Date Value Ref Range Status  05/26/2014 36.4  36.0 - 46.0 % Final    Physical Exam:  Filed Vitals:   05/28/14 0530  BP: 121/68  Pulse: 96  Temp: 98 F (36.7 C)  Resp: 20    General: alert, cooperative and no distress Lochia: appropriate Uterine Fundus: firm DVT Evaluation: No evidence of DVT seen on physical exam. Negative Homan's sign. No cords or calf tenderness. No significant calf/ankle edema.  Discharge Diagnoses: Term Pregnancy-delivered  Discharge Information: Date: 05/28/2014 Activity: pelvic rest Diet: routine Medications: PNV, Ibuprofen and Colace Condition: stable Instructions: refer to practice specific booklet Discharge to: home   Newborn Data: Live born female  Birth Weight: 8 lb 8.2 oz (3860 g) APGAR: 8, 10  Home with mother.  Tawni CarnesWight, Andrew 05/28/2014, 7:41 AM  I examined pt and agree with documentation above and resident plan of care. Eino FarberWalidah Kennith GainN Karim, CNM

## 2014-05-28 NOTE — Progress Notes (Signed)
UR chart review completed.  

## 2014-06-18 ENCOUNTER — Encounter (HOSPITAL_COMMUNITY): Payer: Self-pay

## 2014-09-11 ENCOUNTER — Emergency Department (HOSPITAL_COMMUNITY)
Admission: EM | Admit: 2014-09-11 | Discharge: 2014-09-11 | Disposition: A | Discharge status: Home / Self Care | Payer: Medicaid Other | Attending: Emergency Medicine | Admitting: Emergency Medicine

## 2014-09-11 ENCOUNTER — Encounter (HOSPITAL_COMMUNITY): Payer: Self-pay | Admitting: Emergency Medicine

## 2014-09-11 DIAGNOSIS — Y9389 Activity, other specified: Secondary | ICD-10-CM | POA: Insufficient documentation

## 2014-09-11 DIAGNOSIS — F329 Major depressive disorder, single episode, unspecified: Secondary | ICD-10-CM | POA: Insufficient documentation

## 2014-09-11 DIAGNOSIS — Y92410 Unspecified street and highway as the place of occurrence of the external cause: Secondary | ICD-10-CM | POA: Insufficient documentation

## 2014-09-11 DIAGNOSIS — S299XXA Unspecified injury of thorax, initial encounter: Secondary | ICD-10-CM | POA: Insufficient documentation

## 2014-09-11 DIAGNOSIS — Y998 Other external cause status: Secondary | ICD-10-CM | POA: Insufficient documentation

## 2014-09-11 DIAGNOSIS — Z87891 Personal history of nicotine dependence: Secondary | ICD-10-CM | POA: Insufficient documentation

## 2014-09-11 DIAGNOSIS — Z880 Allergy status to penicillin: Secondary | ICD-10-CM | POA: Insufficient documentation

## 2014-09-11 DIAGNOSIS — Z79899 Other long term (current) drug therapy: Secondary | ICD-10-CM | POA: Insufficient documentation

## 2014-09-11 MED ORDER — IBUPROFEN 800 MG PO TABS
800.0000 mg | ORAL_TABLET | Freq: Once | ORAL | Status: AC
  Administered 2014-09-11: 800 mg via ORAL
  Filled 2014-09-11: qty 1

## 2014-09-11 MED ORDER — IBUPROFEN 800 MG PO TABS: 800.0000 mg | ORAL_TABLET | Freq: Three times a day (TID) | ORAL | Status: AC
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
## 2014-09-11 NOTE — Discharge Instructions (Signed)
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
## 2014-10-13 ENCOUNTER — Inpatient Hospital Stay (HOSPITAL_COMMUNITY)
Admission: AD | Admit: 2014-10-13 | Discharge: 2014-10-13 | Disposition: A | Discharge status: Home / Self Care | Payer: Medicaid Other | Source: Ambulatory Visit | Attending: Obstetrics & Gynecology | Admitting: Obstetrics & Gynecology

## 2014-10-13 ENCOUNTER — Encounter (HOSPITAL_COMMUNITY): Payer: Self-pay | Admitting: *Deleted

## 2014-10-13 DIAGNOSIS — Z87891 Personal history of nicotine dependence: Secondary | ICD-10-CM | POA: Insufficient documentation

## 2014-10-13 DIAGNOSIS — H6502 Acute serous otitis media, left ear: Secondary | ICD-10-CM | POA: Insufficient documentation

## 2014-10-13 DIAGNOSIS — J069 Acute upper respiratory infection, unspecified: Secondary | ICD-10-CM

## 2014-10-13 LAB — URINALYSIS, ROUTINE W REFLEX MICROSCOPIC
BILIRUBIN URINE: NEGATIVE
GLUCOSE, UA: NEGATIVE mg/dL
Ketones, ur: NEGATIVE mg/dL
NITRITE: NEGATIVE
Protein, ur: NEGATIVE mg/dL
Urobilinogen, UA: 0.2 mg/dL (ref 0.0–1.0)
pH: 5.5 (ref 5.0–8.0)

## 2014-10-13 LAB — POCT PREGNANCY, URINE: Preg Test, Ur: NEGATIVE

## 2014-10-13 LAB — URINE MICROSCOPIC-ADD ON

## 2014-10-13 MED ORDER — PHENYLEPHRINE HCL 1 % NA SOLN: 1.0000 [drp] | Freq: Four times a day (QID) | NASAL | Status: AC | PRN

## 2014-10-13 NOTE — MAU Note (Signed)
Pt states has been around others who have been sick. Here with diarrhea, nausea, no vomiting thus far. Has had fever and chills. All began yesterday. LMP-today.

## 2014-10-13 NOTE — MAU Note (Signed)
Also has headache/body ache rates 3/10.

## 2014-10-13 NOTE — MAU Note (Signed)
Has had diarrhea x2.

## 2014-10-13 NOTE — MAU Provider Note (Signed)
CC: Diarrhea and Nausea    First Provider Initiated Contact with Patient 10/13/14 1331      HPI Ann Romero is a 24 y.o. [id number] who presents with onset yesterday of stuffy nose with cough productive of mucus. Has sore throat.Has feeling of fullness in the left ear. She is a smoker. Had loose BM 2 yesterday, normal BM today. Nausea yesterday, not at present. No vomiting. Felt feverish and had chills but did not take temperature. Has not taken analgesic since ibuprofen at 0400. Had influenza vaccine in October. States boyfriend had the flu a week ago. Requests work excuse. LMP 10/13/2014   Past Medical History  Diagnosis Date  . Depression     OB History  Gravida Para Term Preterm AB SAB TAB Ectopic Multiple Living  # Outcome Date GA Lbr Len/2nd Weight Sex Delivery Anes PTL Lv  2 Term 05/26/14 [redacted]w[redacted]d 09:32 / 00:10 3.86 kg (8 lb 8.2 oz) M Vag-Spont EPI  Y  1 Term 08/15/12 [redacted]w[redacted]d   F Vag-Spont EPI N Y      Past Surgical History  Procedure Laterality Date  . No past surgeries      History   Social History  . Marital Status: Single    Spouse Name: N/A  . Number of Children: N/A  . Years of Education: N/A   Occupational History  . Not on file.   Social History Main Topics  . Smoking status: Former Smoker    Quit date: 07/23/2013  . Smokeless tobacco: Never Used  . Alcohol Use: No  . Drug Use: No  . Sexual Activity: Yes    Birth Control/ Protection: None   Other Topics Concern  . Not on file   Social History Narrative    No current facility-administered medications on file prior to encounter.   Current Outpatient Prescriptions on File Prior to Encounter  Medication Sig Dispense Refill  . acetaminophen (TYLENOL) 325 MG tablet Take 650 mg by mouth every 6 (six) hours as needed for mild pain.    Marland Kitchen docusate sodium (COLACE) 100 MG capsule Take 1 capsule (100 mg total) by mouth 2 (two) times daily. 30 capsule 0  . ibuprofen (ADVIL,MOTRIN) 800 MG  tablet Take 1 tablet (800 mg total) by mouth 3 (three) times daily. 21 tablet 0  . Prenatal Vit-Fe Fumarate-FA (PRENATAL MULTIVITAMIN) TABS tablet Take 1 tablet by mouth at bedtime.     . sertraline (ZOLOFT) 50 MG tablet Take 50 mg by mouth daily.      Allergies  Allergen Reactions  . Penicillins Hives     ROS Pertinent items in HPI. No chest pain or SOB. No urinary symptoms or irritative vaginal discharge.   PHYSICAL EXAM Filed Vitals:   10/13/14 1300  BP: 115/65  Pulse: 99  Temp: 97.5 F (36.4 C)  Resp: 18   General: Well nourished, well developed female in no acute distress. Shallow dry cough HEENT Right TM normal left TM with diffuse light reflex and opaque gray.Throat slight erythema, no exudate, no tonsillar enlargement.  Neck: FROM; shotty anterior cervical lymphadenopathy, NT Cardiovascular: RRR w/o m Respiratory: Normal effort. CTA bilaterally Abdomen: Soft, nontender Back: No CVAT Extremities: No edema Neurologic:grossly intact   LAB RESULTS Results for orders placed or performed during the hospital encounter of 10/13/14 (from the past 24 hour(s))  Urinalysis, Routine w reflex microscopic     Status: Abnormal   Collection Time: 10/13/14  1:03 PM  Result Value Ref Range   Color, Urine YELLOW YELLOW   APPearance CLEAR CLEAR   Specific Gravity, Urine <1.005 (L) 1.005 - 1.030   pH 5.5 5.0 - 8.0   Glucose, UA NEGATIVE NEGATIVE mg/dL   Hgb urine dipstick LARGE (A) NEGATIVE   Bilirubin Urine NEGATIVE NEGATIVE   Ketones, ur NEGATIVE NEGATIVE mg/dL   Protein, ur NEGATIVE NEGATIVE mg/dL   Urobilinogen, UA 0.2 0.0 - 1.0 mg/dL   Nitrite NEGATIVE NEGATIVE   Leukocytes, UA SMALL (A) NEGATIVE  Urine microscopic-add on     Status: Abnormal   Collection Time: 10/13/14  1:03 PM  Result Value Ref Range   Squamous Epithelial / LPF FEW (A) RARE   WBC, UA 0-2 <3 WBC/hpf   RBC / HPF 0-2 <3 RBC/hpf   Bacteria, UA FEW (A) RARE  Pregnancy, urine POC     Status: None    Collection Time: 10/13/14  1:15 PM  Result Value Ref Range   Preg Test, Ur NEGATIVE NEGATIVE    IMAGING No results found.  MAU COURSE    ASSESSMENT  1. URI (upper respiratory infection)   2. Acute serous otitis media of left ear, recurrence not specified     PLAN Discharge home. See AVS for patient education.    Medication List    TAKE these medications        acetaminophen 325 MG tablet  Commonly known as:  TYLENOL  Take 650 mg by mouth every 6 (six) hours as needed for mild pain.     docusate sodium 100 MG capsule  Commonly known as:  COLACE  Take 1 capsule (100 mg total) by mouth 2 (two) times daily.     ibuprofen 800 MG tablet  Commonly known as:  ADVIL,MOTRIN  Take 1 tablet (800 mg total) by mouth 3 (three) times daily.     phenylephrine 1 % nasal spray  Commonly known as:  NEO-SYNEPHRINE  Place 1 drop into both nostrils every 6 (six) hours as needed for congestion.     prenatal multivitamin Tabs tablet  Take 1 tablet by mouth at bedtime.     sertraline 50 MG tablet  Commonly known as:  ZOLOFT  Take 50 mg by mouth daily.      Discussed throat lozenges, vaporizer and may use OTC Benadryl as directed  Follow-up Information    [facility] [place]   Why:  If symptoms worsen   Contact information:   [address] [facility] [place] [id number] [phone]      Danae OrleansDeirdre C Lycan Davee, CNM 10/13/2014 1:32 PM

## 2014-10-13 NOTE — Discharge Instructions (Signed)
Cool Mist Vaporizers °Vaporizers may help relieve the symptoms of a cough and cold. They add moisture to the air, which helps mucus to become thinner and less sticky. This makes it easier to breathe and cough up secretions. Cool mist vaporizers do not cause serious burns like hot mist vaporizers, which may also be called steamers or humidifiers. Vaporizers have not been proven to help with colds. You should not use a vaporizer if you are allergic to mold. °HOME CARE INSTRUCTIONS °· Follow the package instructions for the vaporizer. °· Do not use anything other than distilled water in the vaporizer. °· Do not run the vaporizer all of the time. This can cause mold or bacteria to grow in the vaporizer. °· Clean the vaporizer after each time it is used. °· Clean and dry the vaporizer well before storing it. °· Stop using the vaporizer if worsening respiratory symptoms develop. °Document Released: 04/30/2004 Document Revised: 08/08/2013 Document Reviewed: 12/21/2012 °[employer]® Patient Information ©2015 [employer]. This information is not intended to replace advice given to you by your health care provider. Make sure you discuss any questions you have with your health care provider. ° °

## 2015-01-21 IMAGING — US US OB TRANSVAGINAL
1 series · 14 of 28 positions shown · non-contrast
Comparison: None.

CLINICAL DATA: Bright red vaginal spotting.

EXAM:
OBSTETRIC <14 WK US AND TRANSVAGINAL OB US
TECHNIQUE: Both transabdominal and transvaginal ultrasound examinations were
performed for complete evaluation of the gestation as well as the
maternal uterus, adnexal regions, and pelvic cul-de-sac.
Transvaginal technique was performed to assess early pregnancy.

[Series 1: us ob comp less 14 wks · 14 of 44 slices shown]
[im 2/44]
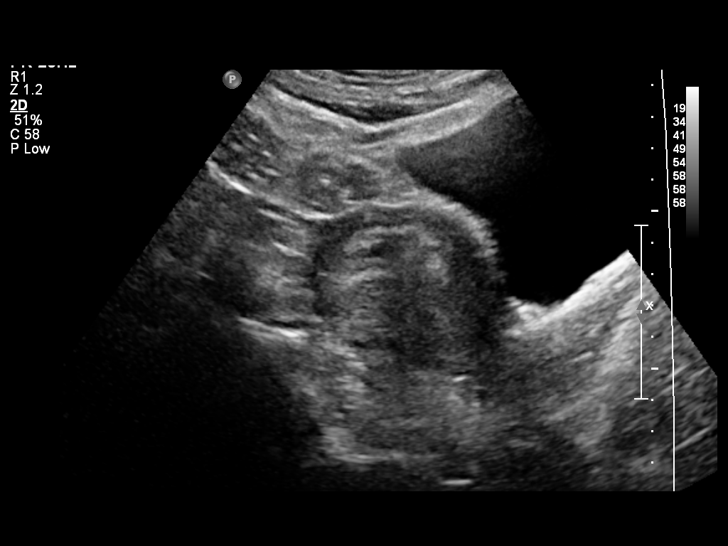
[im 5/44]
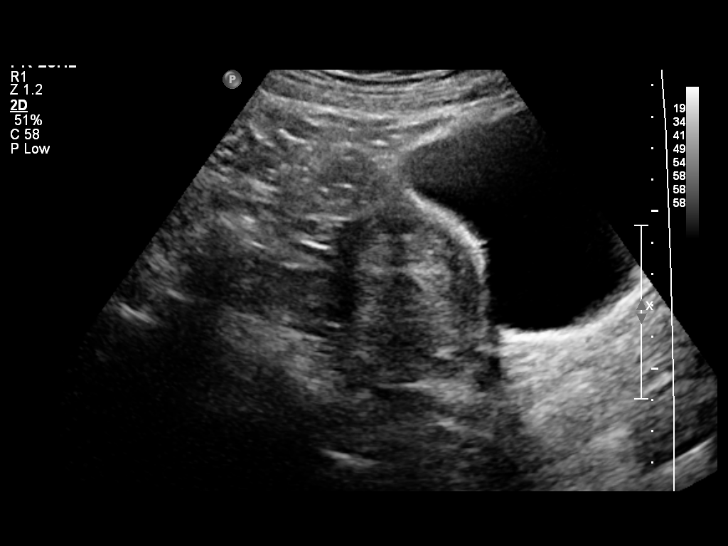
[im 8/44]
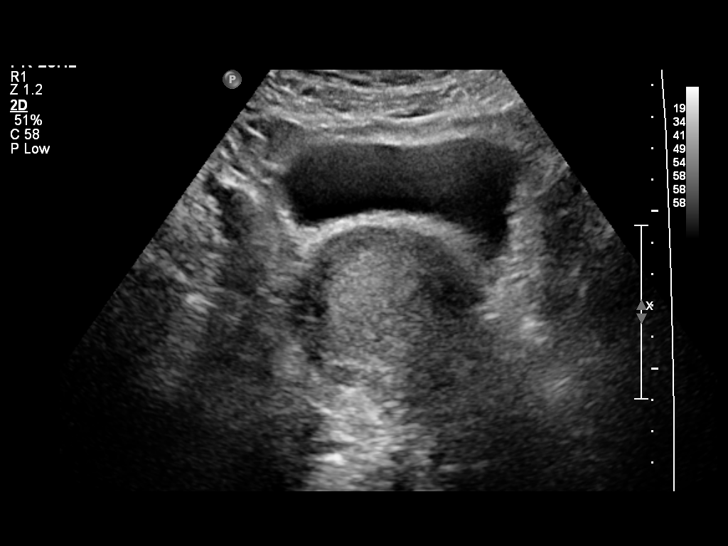
[im 12/44]
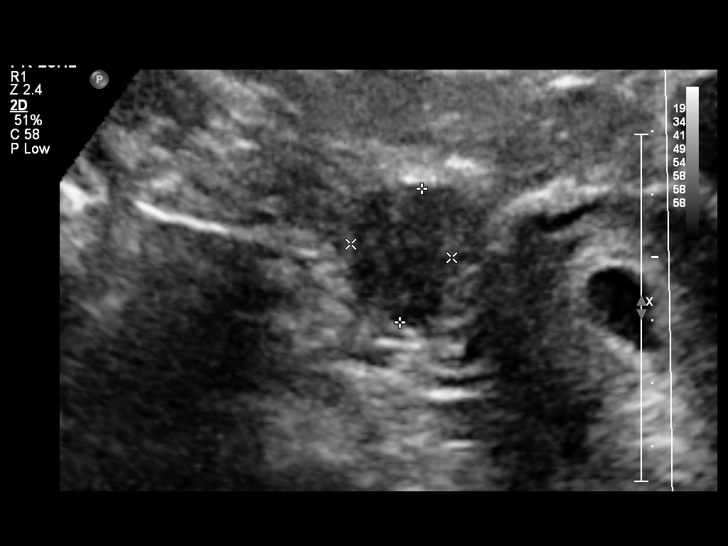
[im 15/44]
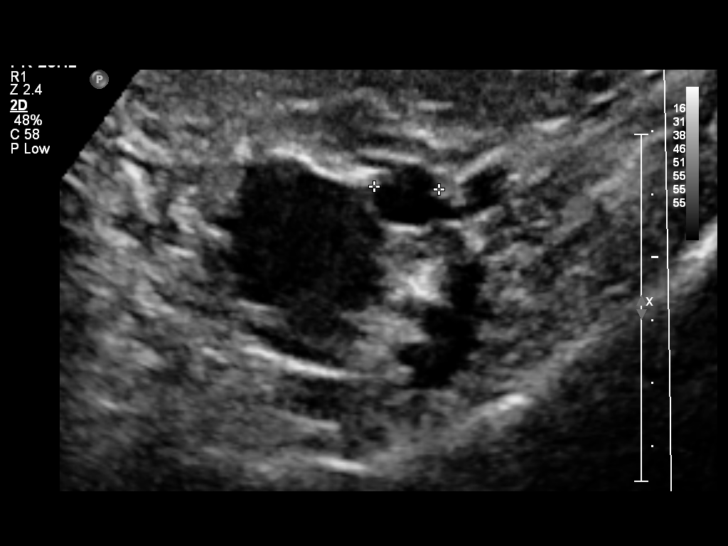
[im 18/44]
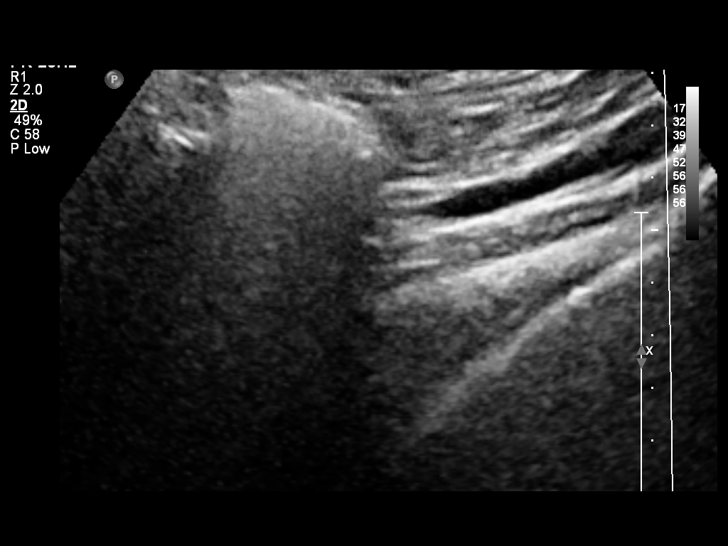
[im 21/44]
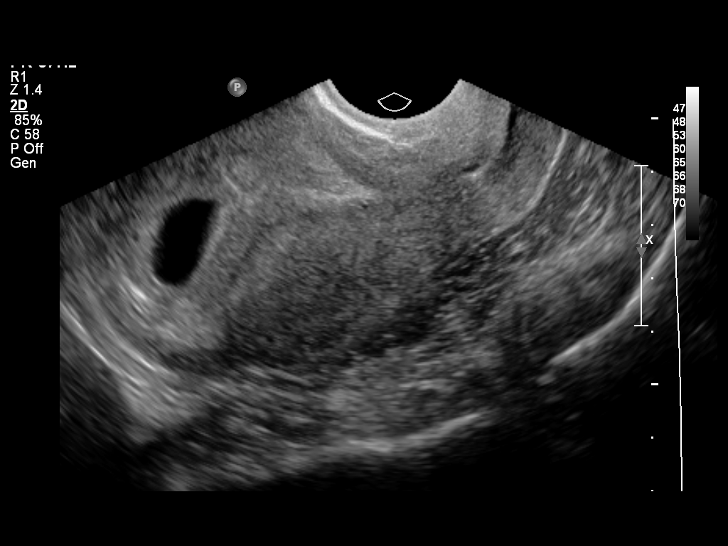
[im 24/44]
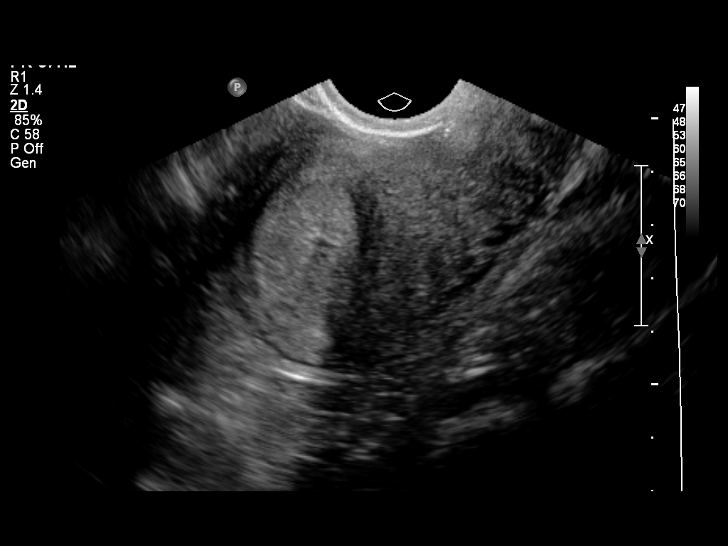
[im 28/44]
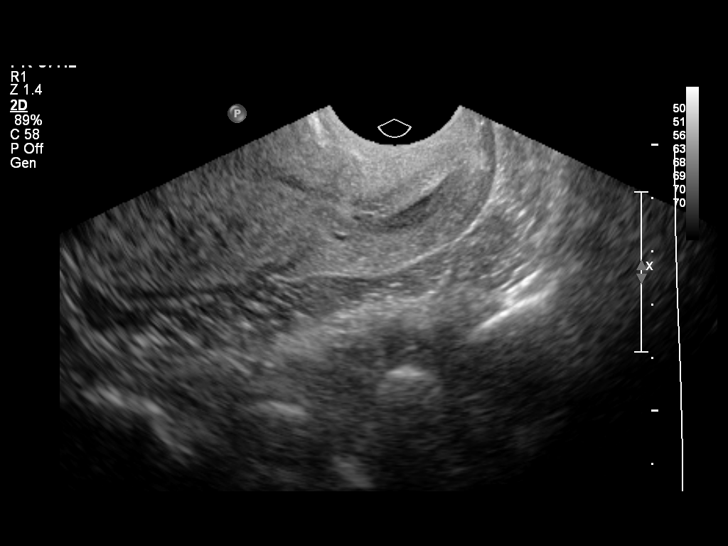
[im 31/44]
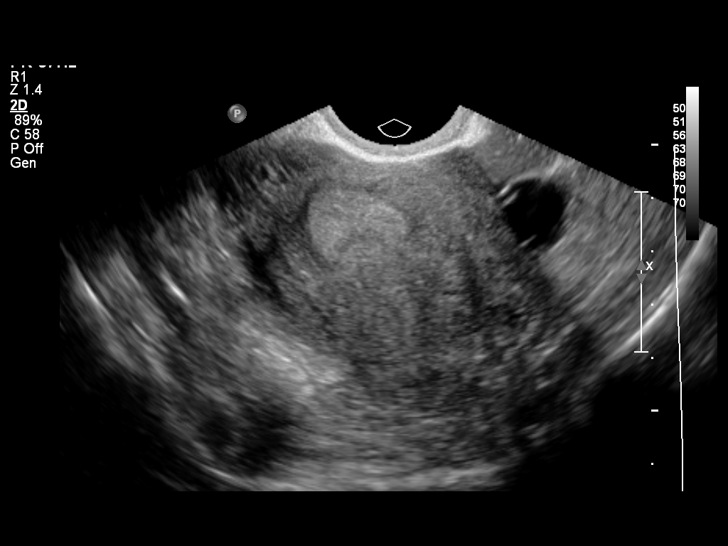
[im 34/44]
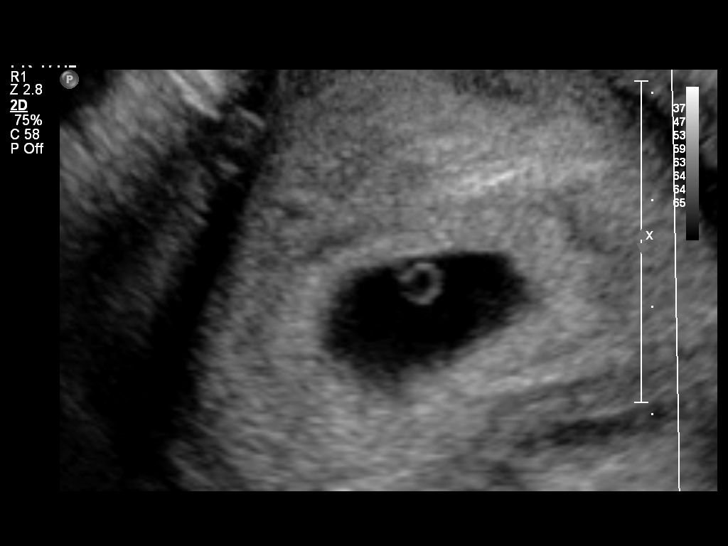
[im 37/44]
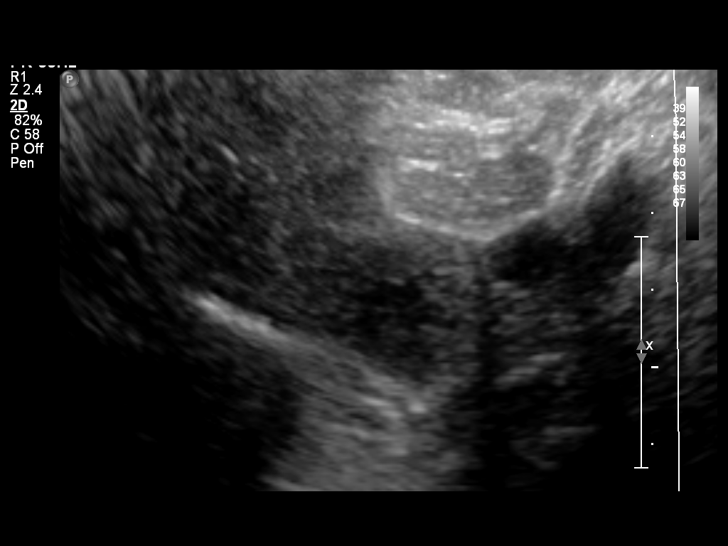
[im 40/44]
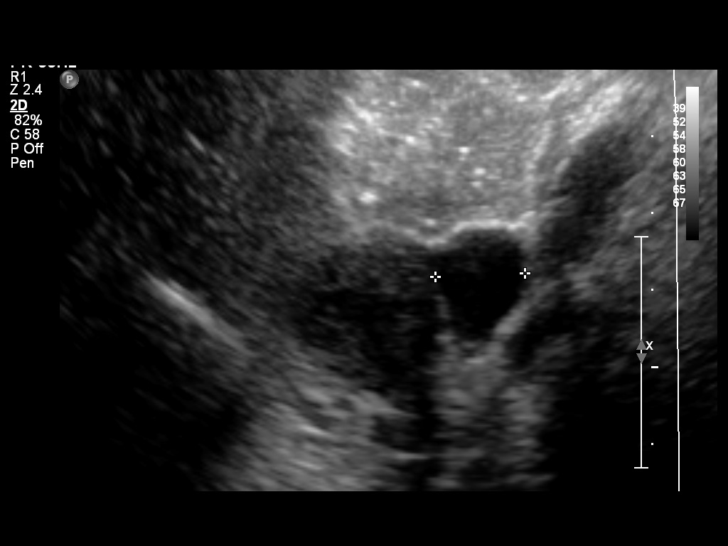
[im 44/44]
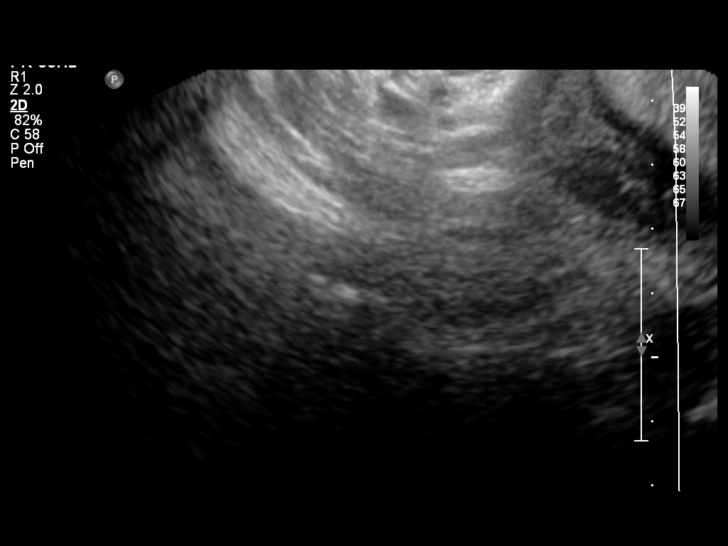

[14 of 28 positions shown; findings below may reference images not displayed]

FINDINGS: Intrauterine gestational sac: Visualized/normal in shape.

Yolk sac:  Yes

Embryo:  No

Cardiac Activity: N/A

MSD:  15.7  mm   6 w   3  d

Maternal uterus/adnexae: No subchorionic hemorrhage is noted. The
uterus is otherwise unremarkable in appearance.

The right ovary is not visualized. The left ovary is unremarkable in
appearance. It measures 2.1 x 1.6 x 1.7 cm.

No free fluid is seen in the pelvic cul-de-sac.
IMPRESSION: Single intrauterine gestational sac noted, with a yolk sac
visualized. The embryo is not yet seen. The mean sac diameter of
cm corresponds to a gestational age of 6 weeks 3 days. This matches
the gestational age of 7 weeks 1 day by LMP, reflecting an estimated
date of delivery May 24, 2014.

## 2015-05-26 IMAGING — US US OB FOLLOW-UP
1 series · 12 of 28 positions shown · non-contrast
Comparison: none

[Series 1: us ob follow up · 45 acquisitions, 12 frames shown]
[im 2/45]
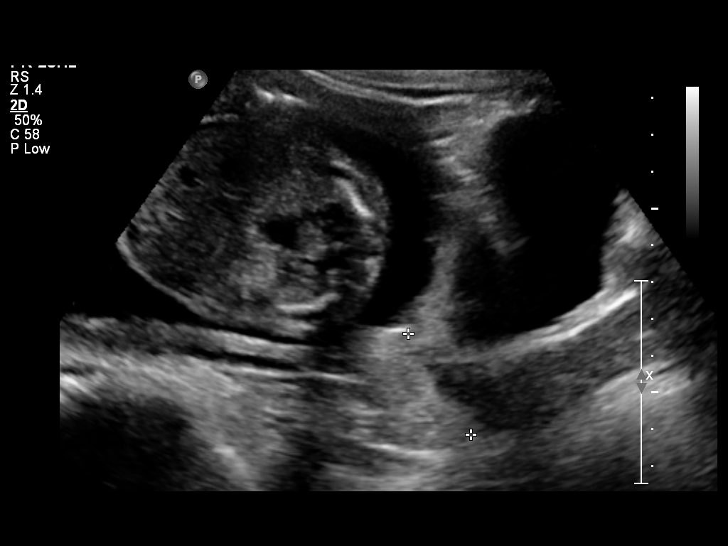
[im 5/45]
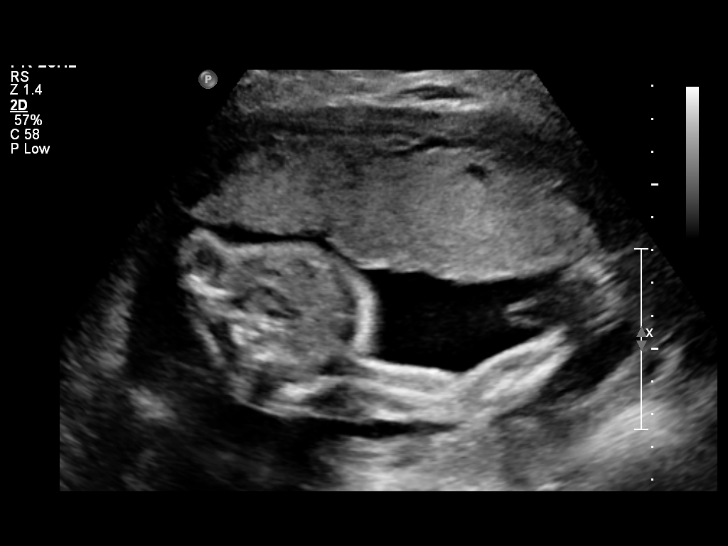
[im 9/45]
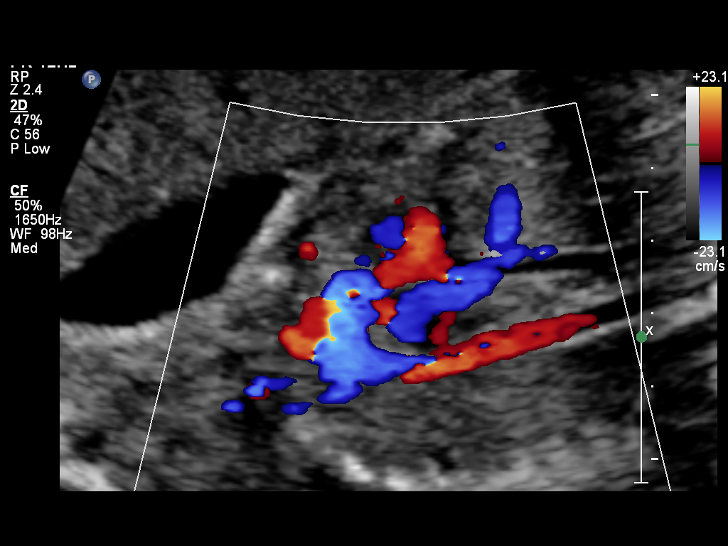
[im 14/45]
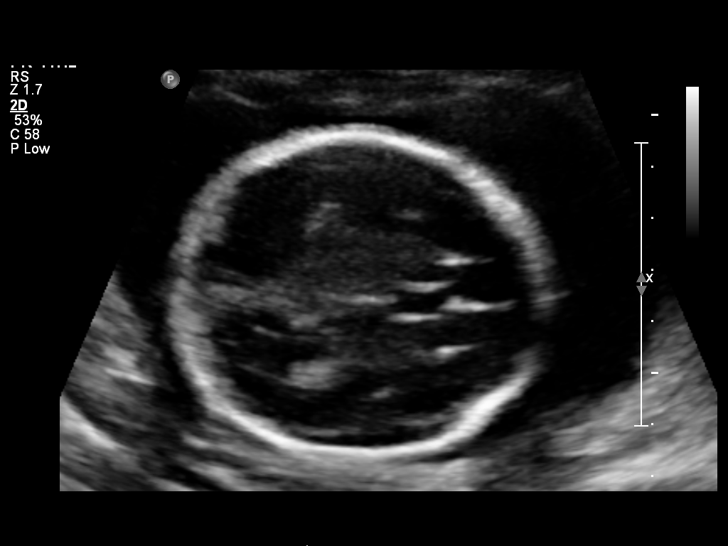
[im 17/45]
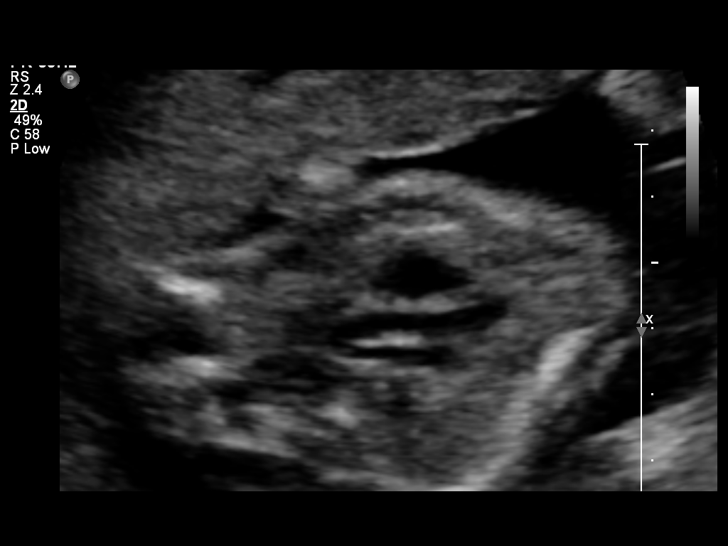
[im 20/45]
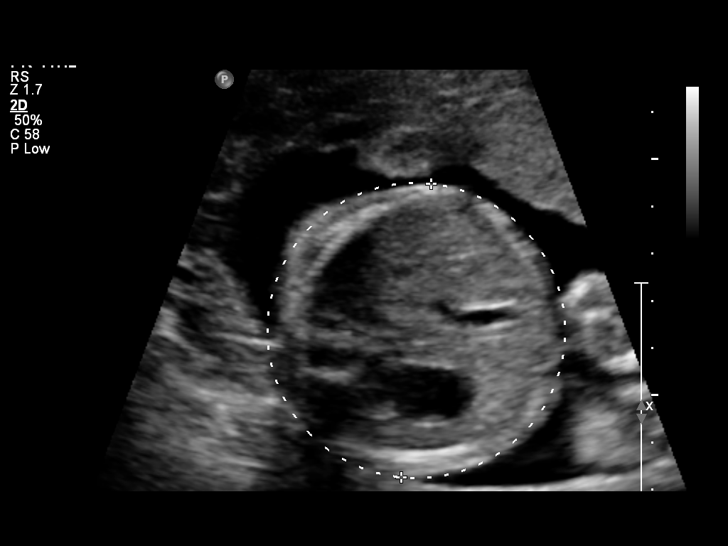
[im 25/45]
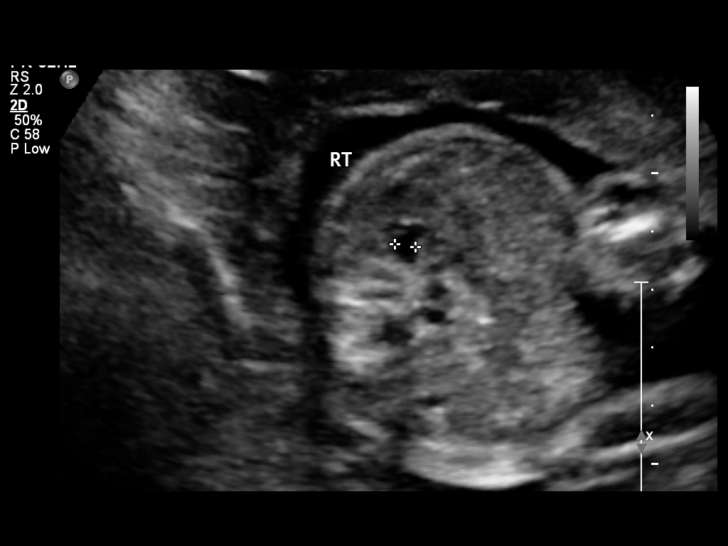
[im 28/45]
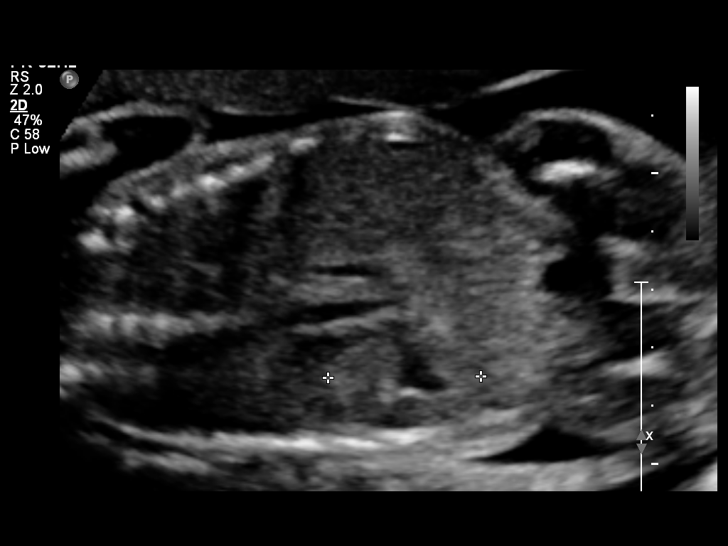
[im 31/45]
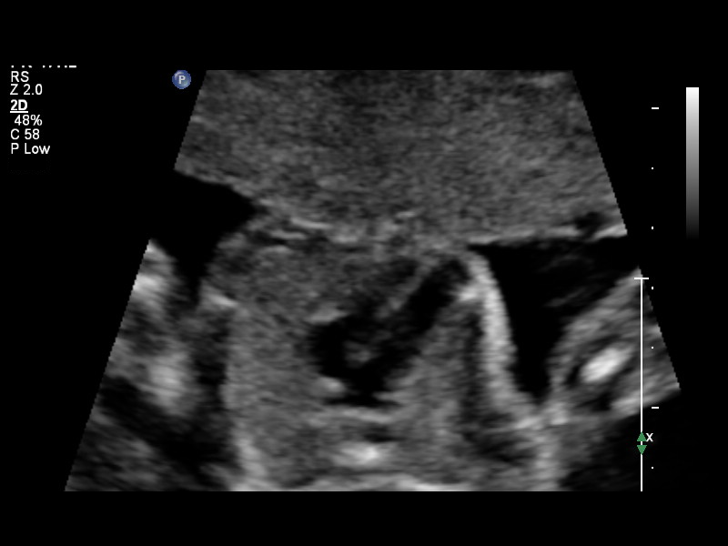
[im 36/45]
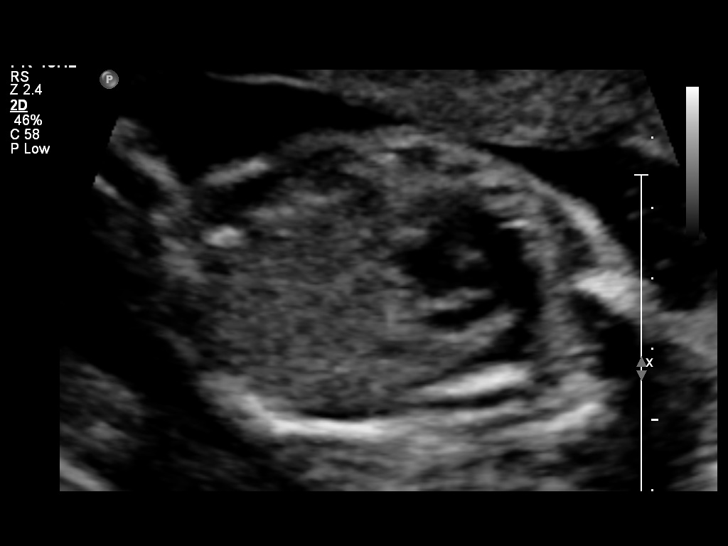
[im 40/45]
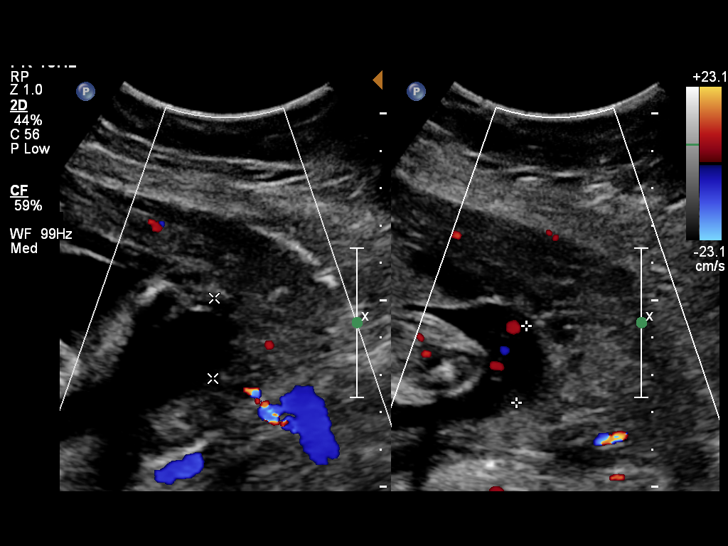
[im 43/45]
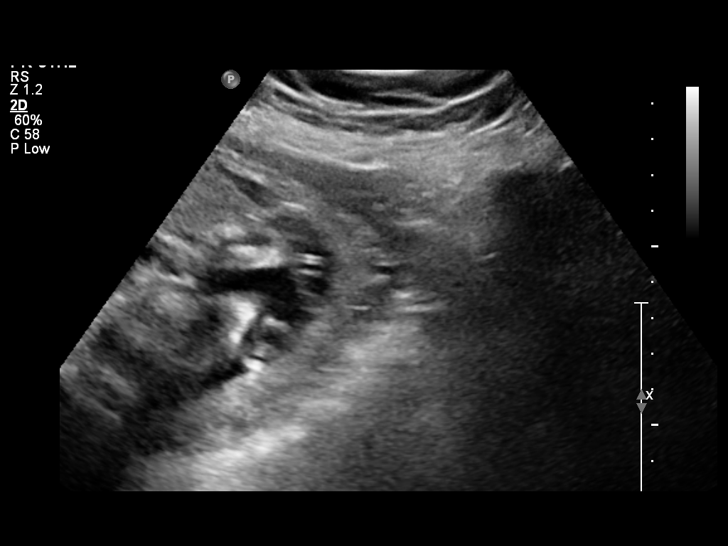

[12 of 28 positions shown; findings below may reference images not displayed]

OBSTETRICS REPORT
                      (Signed Final 02/08/2014 [DATE])

Service(s) Provided

 US OB FOLLOW UP                                       76816.1
Indications

 Evaluate anatomy not seen on prior sonogram
Fetal Evaluation

 Num Of Fetuses:    1
 Fetal Heart Rate:  147                          bpm
 Cardiac Activity:  Observed
 Presentation:      Frank breech
 Placenta:          Anterior, above cervical os
 P. Cord            Previously Visualized
 Insertion:

 Amniotic Fluid
 AFI FV:      Subjectively within normal limits
 AFI Sum:     12.79   cm       33  %Tile     Larg Pckt:    5.59  cm
 RUQ:   5.59    cm   RLQ:    2.14   cm    LUQ:   3       cm   LLQ:    2.06   cm
Biometry

 BPD:     61.1  mm     G. Age:  24w 6d                CI:        74.84   70 - 86
                                                      FL/HC:      18.3   18.7 -

 HC:     224.1  mm     G. Age:  24w 3d       55  %    HC/AC:      1.14   1.05 -

 AC:     196.8  mm     G. Age:  24w 3d       57  %    FL/BPD:     67.3   71 - 87
 FL:      41.1  mm     G. Age:  23w 2d       22  %    FL/AC:      20.9   20 - 24
 HUM:       39  mm     G. Age:  23w 6d       42  %
 Est. FW:     651  gm      1 lb 7 oz     55  %
Gestational Age

 LMP:           25w 0d        Date:  08/17/13                 EDD:   05/24/14
 U/S Today:     24w 2d                                        EDD:   05/29/14
 Best:          23w 6d     Det. By:  Early Ultrasound         EDD:   06/01/14
Anatomy

 Cranium:          Appears normal         Aortic Arch:      Appears normal
 Fetal Cavum:      Appears normal         Ductal Arch:      Previously seen
 Ventricles:       Appears normal         Diaphragm:        Appears normal
 Choroid Plexus:   Previously seen        Stomach:          Appears normal, left
                                                            sided
 Cerebellum:       Previously seen        Abdomen:          Appears normal
 Posterior Fossa:  Previously seen        Abdominal Wall:   Previously seen
 Nuchal Fold:      Not applicable (>20    Cord Vessels:     Previously seen
                   wks GA)
 Face:             Profile appears        Kidneys:          Appear normal
                   normal
 Lips:             Previously seen        Bladder:          Appears normal
 Heart:            Appears normal         Spine:            Previously seen
                   (4CH, axis, and
                   situs)
 RVOT:             Appears normal         Lower             Previously seen
                                          Extremities:
 LVOT:             Appears normal         Upper             Previously seen
                                          Extremities:

 Other:  Fetus appears to be a male. Nasal bone visualized. Heels visualized.
         Technically difficult due to fetal position.
Targeted Anatomy

 Fetal Central Nervous System
 Lat. Ventricles:  0.64 cm
Cervix Uterus Adnexa

 Cervical Length:    3.22     cm

 Cervix:       Normal appearance by transabdominal scan.
 Left Ovary:    No adnexal mass visualized.
 Right Ovary:   No adnexal mass visualized.
Impression

 SIUP at 23+6 weeks
 Normal interval anatomy; anatomic survey complete
 Normal amniotic fluid volume
 Appropriate interval growth with EFW at the 55th %tile
Recommendations

 Follow-up as clinically indicated
